# Patient Record
Sex: Male | Born: 1970 | Race: White | Hispanic: No | Marital: Single | Smoking: Former smoker
Health system: Southern US, Community
[De-identification: ages and names within clinical notes are randomized; demographics above are authoritative.]

## PROBLEM LIST (undated history)

## (undated) DIAGNOSIS — K259 Gastric ulcer, unspecified as acute or chronic, without hemorrhage or perforation: Secondary | ICD-10-CM

---

## 2010-10-21 HISTORY — PX: GASTRIC BYPASS: SHX52

## 2015-08-10 ENCOUNTER — Encounter (HOSPITAL_COMMUNITY): Payer: Self-pay | Admitting: Emergency Medicine

## 2015-08-10 ENCOUNTER — Inpatient Hospital Stay (HOSPITAL_COMMUNITY)
Admission: EM | Admit: 2015-08-10 | Discharge: 2015-08-21 | DRG: 329 | Disposition: A | Discharge status: Home / Self Care | Payer: BLUE CROSS/BLUE SHIELD | Attending: Internal Medicine | Admitting: Internal Medicine

## 2015-08-10 DIAGNOSIS — K921 Melena: Secondary | ICD-10-CM | POA: Diagnosis not present

## 2015-08-10 DIAGNOSIS — K56609 Unspecified intestinal obstruction, unspecified as to partial versus complete obstruction: Secondary | ICD-10-CM | POA: Diagnosis not present

## 2015-08-10 DIAGNOSIS — R55 Syncope and collapse: Secondary | ICD-10-CM | POA: Diagnosis present

## 2015-08-10 DIAGNOSIS — K559 Vascular disorder of intestine, unspecified: Secondary | ICD-10-CM | POA: Diagnosis not present

## 2015-08-10 DIAGNOSIS — R0989 Other specified symptoms and signs involving the circulatory and respiratory systems: Secondary | ICD-10-CM

## 2015-08-10 DIAGNOSIS — Z9884 Bariatric surgery status: Secondary | ICD-10-CM

## 2015-08-10 DIAGNOSIS — Z8711 Personal history of peptic ulcer disease: Secondary | ICD-10-CM

## 2015-08-10 DIAGNOSIS — D62 Acute posthemorrhagic anemia: Secondary | ICD-10-CM | POA: Diagnosis present

## 2015-08-10 DIAGNOSIS — Z8249 Family history of ischemic heart disease and other diseases of the circulatory system: Secondary | ICD-10-CM

## 2015-08-10 DIAGNOSIS — K450 Other specified abdominal hernia with obstruction, without gangrene: Secondary | ICD-10-CM | POA: Clinically undetermined

## 2015-08-10 DIAGNOSIS — W1839XA Other fall on same level, initial encounter: Secondary | ICD-10-CM | POA: Diagnosis present

## 2015-08-10 DIAGNOSIS — D649 Anemia, unspecified: Secondary | ICD-10-CM | POA: Diagnosis present

## 2015-08-10 DIAGNOSIS — D509 Iron deficiency anemia, unspecified: Secondary | ICD-10-CM | POA: Diagnosis present

## 2015-08-10 DIAGNOSIS — E876 Hypokalemia: Secondary | ICD-10-CM | POA: Diagnosis present

## 2015-08-10 DIAGNOSIS — K644 Residual hemorrhoidal skin tags: Secondary | ICD-10-CM | POA: Diagnosis present

## 2015-08-10 DIAGNOSIS — E46 Unspecified protein-calorie malnutrition: Secondary | ICD-10-CM | POA: Diagnosis present

## 2015-08-10 DIAGNOSIS — R571 Hypovolemic shock: Secondary | ICD-10-CM | POA: Diagnosis not present

## 2015-08-10 DIAGNOSIS — K922 Gastrointestinal hemorrhage, unspecified: Secondary | ICD-10-CM | POA: Diagnosis present

## 2015-08-10 DIAGNOSIS — N179 Acute kidney failure, unspecified: Secondary | ICD-10-CM | POA: Diagnosis present

## 2015-08-10 DIAGNOSIS — R000 Tachycardia, unspecified: Secondary | ICD-10-CM | POA: Diagnosis not present

## 2015-08-10 DIAGNOSIS — Z833 Family history of diabetes mellitus: Secondary | ICD-10-CM

## 2015-08-10 DIAGNOSIS — R069 Unspecified abnormalities of breathing: Secondary | ICD-10-CM

## 2015-08-10 DIAGNOSIS — R6521 Severe sepsis with septic shock: Secondary | ICD-10-CM | POA: Diagnosis not present

## 2015-08-10 DIAGNOSIS — Z79899 Other long term (current) drug therapy: Secondary | ICD-10-CM

## 2015-08-10 DIAGNOSIS — I10 Essential (primary) hypertension: Secondary | ICD-10-CM | POA: Diagnosis not present

## 2015-08-10 DIAGNOSIS — Z8719 Personal history of other diseases of the digestive system: Secondary | ICD-10-CM | POA: Diagnosis not present

## 2015-08-10 DIAGNOSIS — Z87891 Personal history of nicotine dependence: Secondary | ICD-10-CM

## 2015-08-10 DIAGNOSIS — D689 Coagulation defect, unspecified: Secondary | ICD-10-CM | POA: Diagnosis not present

## 2015-08-10 DIAGNOSIS — K460 Unspecified abdominal hernia with obstruction, without gangrene: Secondary | ICD-10-CM | POA: Diagnosis present

## 2015-08-10 DIAGNOSIS — S0181XA Laceration without foreign body of other part of head, initial encounter: Secondary | ICD-10-CM | POA: Diagnosis present

## 2015-08-10 DIAGNOSIS — A419 Sepsis, unspecified organism: Secondary | ICD-10-CM | POA: Diagnosis present

## 2015-08-10 HISTORY — DX: Gastric ulcer, unspecified as acute or chronic, without hemorrhage or perforation: K25.9

## 2015-08-10 LAB — BASIC METABOLIC PANEL
ANION GAP: 6 (ref 5–15)
BUN: 36 mg/dL — ABNORMAL HIGH (ref 6–20)
CALCIUM: 8.5 mg/dL — AB (ref 8.9–10.3)
CO2: 24 mmol/L (ref 22–32)
CREATININE: 1.17 mg/dL (ref 0.61–1.24)
Chloride: 108 mmol/L (ref 101–111)
Glucose, Bld: 125 mg/dL — ABNORMAL HIGH (ref 65–99)
Potassium: 4.7 mmol/L (ref 3.5–5.1)
Sodium: 138 mmol/L (ref 135–145)

## 2015-08-10 LAB — PROTIME-INR
INR: 1.16
PROTHROMBIN TIME: 14.9 s (ref 11.4–15.2)

## 2015-08-10 LAB — CBC
HCT: 23.7 % — ABNORMAL LOW (ref 39.0–52.0)
Hemoglobin: 7.3 g/dL — ABNORMAL LOW (ref 13.0–17.0)
MCH: 23.2 pg — ABNORMAL LOW (ref 26.0–34.0)
MCHC: 30.8 g/dL (ref 30.0–36.0)
MCV: 75.5 fL — AB (ref 78.0–100.0)
PLATELETS: 334 10*3/uL (ref 150–400)
RBC: 3.14 MIL/uL — AB (ref 4.22–5.81)
RDW: 16.4 % — AB (ref 11.5–15.5)
WBC: 10.1 10*3/uL (ref 4.0–10.5)

## 2015-08-10 LAB — URINE MICROSCOPIC-ADD ON
RBC / HPF: NONE SEEN RBC/hpf (ref 0–5)
SQUAMOUS EPITHELIAL / LPF: NONE SEEN

## 2015-08-10 LAB — ABO/RH: ABO/RH(D): O POS

## 2015-08-10 LAB — PREPARE RBC (CROSSMATCH)

## 2015-08-10 LAB — URINALYSIS, ROUTINE W REFLEX MICROSCOPIC
Glucose, UA: NEGATIVE mg/dL
Hgb urine dipstick: NEGATIVE
KETONES UR: NEGATIVE mg/dL
NITRITE: NEGATIVE
PROTEIN: NEGATIVE mg/dL
Specific Gravity, Urine: 1.025 (ref 1.005–1.030)
pH: 6.5 (ref 5.0–8.0)

## 2015-08-10 LAB — CBG MONITORING, ED: GLUCOSE-CAPILLARY: 97 mg/dL (ref 65–99)

## 2015-08-10 MED ORDER — LIDOCAINE-EPINEPHRINE (PF) 1 %-1:200000 IJ SOLN
20.0000 mL | Freq: Once | INTRAMUSCULAR | Status: AC
  Administered 2015-08-10: 20 mL
  Filled 2015-08-10: qty 30

## 2015-08-10 MED ORDER — SODIUM CHLORIDE 0.9 % IV BOLUS (SEPSIS)
1000.0000 mL | Freq: Once | INTRAVENOUS | Status: AC
  Administered 2015-08-10: 1000 mL via INTRAVENOUS

## 2015-08-10 NOTE — ED Triage Notes (Signed)
Patient BIB [facility] from the [facility] after a syncopal episode. Patient stood up from having  dark red-bloody BM  and passed out. This was 3rd bloody bm of the day.  Patient states syncopal episoed lasted about then crawled to phone to call EMS. Pt has HX of bleeding ulcer, origin unknown, no symptoms in 2 yrs. Laceration noted to chin and forehead. Patient denies neck or back injury. Patient denies pain. VSS. EMS unsuccessful at starting iv. Patient had Gastric bypass in 2012.

## 2015-08-10 NOTE — ED Notes (Signed)
After moving around in bed, patient realizes he has pain in his rt leg. States knee hurts when bends it.

## 2015-08-10 NOTE — ED Provider Notes (Signed)
[facility]-EMERGENCY DEPT Provider Note   CSN: [id number] Arrival date & time: 08/10/15  2028   First MD Initiated Contact with Patient 08/10/15 2118    By signing my name below, I, Levon Hedger, attest that this documentation has been prepared under the direction and in the presence of non-physician practitioner, Tommy Rainwater, Electronically Signed: Levon Hedger, Scribe. 08/10/2015. 9:33 PM.   History   Chief Complaint Chief Complaint  Patient presents with  . Loss of Consciousness    HPI Walter Shields is a 45 y.o. male with PMHx of gastric ulcer brought in by ambulance who presents to the Emergency Department complaining of sudden onset syncope PTA. Pt states he had a dark red bowel movement, stood up from using the bathroom, and lost consciousness. Pt was alone during the incident. He estimates he was unconscious for one minute before waking up and calling EMS. Pt notes associated hematochezia and lacerations to chin and forehead from the fall. He denies tobacco use. Pt is not taking any blood thinners. He denies vomiting, melena, abdominal pain, or dizziness. Pt states he is otherwise in good health.  The history is provided by the patient. No language interpreter was used.    Past Medical History:  Diagnosis Date  . Gastric ulcer     There are no active problems to display for this patient.   Past Surgical History:  Procedure Laterality Date  . GASTRIC BYPASS  10/21/2010      Home Medications    Prior to Admission medications   Not on File    Family History No family history on file.  Social History Social History  Substance Use Topics  . Smoking status: Not on file  . Smokeless tobacco: Not on file  . Alcohol use Not on file     Allergies   Review of patient's allergies indicates not on file.   Review of Systems Review of Systems  Constitutional: Negative for fever.  Gastrointestinal: Positive for blood in stool. Negative for abdominal pain  and vomiting.  Skin: Positive for wound.  Neurological: Positive for syncope. Negative for dizziness.  Hematological: Does not bruise/bleed easily.  All other systems reviewed and are negative.    Physical Exam Updated Vital Signs BP 113/87 (BP Location: Left Arm)   Pulse 85   Temp 98.7 F (37.1 C) (Oral)   Resp 15   Ht 5\' 7"  (1.702 m)   Wt 175 lb (79.4 kg)   SpO2 100%   BMI 27.41 kg/m   Physical Exam  Constitutional: He appears well-developed and well-nourished.  Patient is awake, alert and in NAD.  HENT:  Head: Normocephalic and atraumatic.  Eyes:  Conjunctiva pale   Neck: Neck supple.  Cardiovascular: Normal rate and regular rhythm.   No murmur heard. Pulmonary/Chest: Effort normal and breath sounds normal. No respiratory distress.  Abdominal: Soft. There is no tenderness.  Genitourinary:  Genitourinary Comments: There is dark red blood present at the rectum.  Musculoskeletal: He exhibits no edema.  Neurological: He is alert.  Skin: Skin is warm and dry. Capillary refill takes 2 to 3 seconds.  Psychiatric: He has a normal mood and affect.  Nursing note and vitals reviewed.    ED Treatments / Results  DIAGNOSTIC STUDIES:  Oxygen Saturation is 100% on RA, normal by my interpretation.    COORDINATION OF CARE:  9:25 PM Will order Type and Screen and Protime-INR, Discussed treatment plan with pt at bedside and pt agreed to plan.  Labs (all  labs ordered are listed, but only abnormal results are displayed) Labs Reviewed  CBC  URINALYSIS, ROUTINE W REFLEX MICROSCOPIC (NOT AT [facility])  BASIC METABOLIC PANEL  CBG MONITORING, ED    EKG  EKG Interpretation None       Radiology No results found.  Procedures Procedures (including critical care time) LACERATION REPAIR Performed by: Elpidio Anis A Authorized by: Elpidio Anis A Consent: Verbal consent obtained. Risks and benefits: risks, benefits and alternatives were discussed Consent given by:  patient Patient identity confirmed: provided demographic data Prepped and Draped in normal sterile fashion Wound explored  Laceration Location: chin  Laceration Length: 3cm  No Foreign Bodies seen or palpated  Anesthesia: local infiltration  Local anesthetic: lidocaine 2% w/epinephrine  Anesthetic total: 2 ml  Irrigation method: syringe Amount of cleaning: standard  Skin closure: 6-0 prolene  Number of sutures: 8  Technique: simple interrupted  Patient tolerance: Patient tolerated the procedure well with no immediate complications.  LACERATION REPAIR Performed by: Elpidio Anis A Authorized by: Elpidio Anis A Consent: Verbal consent obtained. Risks and benefits: risks, benefits and alternatives were discussed Consent given by: patient Patient identity confirmed: provided demographic data Prepped and Draped in normal sterile fashion Wound explored  Laceration Location: left forehead  Laceration Length: 2 cm  No Foreign Bodies seen or palpated  Anesthesia: local infiltration  Local anesthetic: lidocaine 2% w/epinephrine  Anesthetic total: 1 ml  Irrigation method: syringe Amount of cleaning: standard  Skin closure: 6-0 prolene  Number of sutures: 4  Technique: simple interrupted  Patient tolerance: Patient tolerated the procedure well with no immediate complications.  Medications Ordered in ED Medications - No data to display   Initial Impression / Assessment and Plan / ED Course  I have reviewed the triage vital signs and the nursing notes.  Pertinent labs & imaging results that were available during my care of the patient were reviewed by me and considered in my medical decision making (see chart for details).  Clinical Course    Patient with multiple bloody bowel movements today (x 3) with brief syncopal episode after 3rd bowel movement. He reports history of GI bleeding in the past secondary to "lower GI ulcerations" cared for at [facility]. Records have been requested.   Patient has a hemoglobin of 7.3. No tachycardia or hypotension and appears comfortable in NAD. Transfusion ordered. Lacerations repaired per procedure note. Patient appears stable with symptomatic anemia and new GI bleed, suspect lower. Will admit to hospitalist with anticipated GI consult while inpatient.  CRITICAL CARE Performed by: Elpidio Anis A   Total critical care time: 20 minutes  Critical care time was exclusive of separately billable procedures and treating other patients.  Critical care was necessary to treat or prevent imminent or life-threatening deterioration.  Critical care was time spent personally by me on the following activities: development of treatment plan with patient and/or surrogate as well as nursing, discussions with consultants, evaluation of patient's response to treatment, examination of patient, obtaining history from patient or surrogate, ordering and performing treatments and interventions, ordering and review of laboratory studies, ordering and review of radiographic studies, pulse oximetry and re-evaluation of patient's condition.   Final Clinical Impressions(s) / ED Diagnoses   Final diagnoses:  None  1. Lower GI bleeding 2. Symptomatic anemia requiring transfusion 3. Facial lacerations  I personally performed the services described in this documentation, which was scribed in my presence. The recorded information has been reviewed and is accurate.  New Prescriptions New Prescriptions   No medications on file     Elpidio Anis, PA-C 08/10/15 2358    Maia Plan, MD 08/11/15 1149

## 2015-08-10 NOTE — ED Notes (Signed)
Bed: UY40 Expected date:  Expected time:  Means of arrival:  Comments: EMS- syncopal episode; lacerations;

## 2015-08-11 ENCOUNTER — Encounter (HOSPITAL_COMMUNITY): Payer: Self-pay | Admitting: Family Medicine

## 2015-08-11 ENCOUNTER — Encounter (HOSPITAL_COMMUNITY)
Admission: EM | Disposition: A | Discharge status: Home / Self Care | Payer: Self-pay | Source: Home / Self Care | Attending: Internal Medicine

## 2015-08-11 DIAGNOSIS — E46 Unspecified protein-calorie malnutrition: Secondary | ICD-10-CM | POA: Diagnosis present

## 2015-08-11 DIAGNOSIS — K3189 Other diseases of stomach and duodenum: Secondary | ICD-10-CM | POA: Diagnosis not present

## 2015-08-11 DIAGNOSIS — Z9884 Bariatric surgery status: Secondary | ICD-10-CM | POA: Diagnosis not present

## 2015-08-11 DIAGNOSIS — R000 Tachycardia, unspecified: Secondary | ICD-10-CM | POA: Diagnosis not present

## 2015-08-11 DIAGNOSIS — I10 Essential (primary) hypertension: Secondary | ICD-10-CM | POA: Diagnosis not present

## 2015-08-11 DIAGNOSIS — A419 Sepsis, unspecified organism: Secondary | ICD-10-CM | POA: Diagnosis present

## 2015-08-11 DIAGNOSIS — N179 Acute kidney failure, unspecified: Secondary | ICD-10-CM | POA: Diagnosis present

## 2015-08-11 DIAGNOSIS — K559 Vascular disorder of intestine, unspecified: Secondary | ICD-10-CM | POA: Diagnosis present

## 2015-08-11 DIAGNOSIS — W1839XA Other fall on same level, initial encounter: Secondary | ICD-10-CM | POA: Diagnosis present

## 2015-08-11 DIAGNOSIS — K460 Unspecified abdominal hernia with obstruction, without gangrene: Secondary | ICD-10-CM | POA: Diagnosis present

## 2015-08-11 DIAGNOSIS — D649 Anemia, unspecified: Secondary | ICD-10-CM | POA: Diagnosis not present

## 2015-08-11 DIAGNOSIS — Z8719 Personal history of other diseases of the digestive system: Secondary | ICD-10-CM | POA: Diagnosis not present

## 2015-08-11 DIAGNOSIS — K922 Gastrointestinal hemorrhage, unspecified: Secondary | ICD-10-CM | POA: Diagnosis present

## 2015-08-11 DIAGNOSIS — D509 Iron deficiency anemia, unspecified: Secondary | ICD-10-CM | POA: Diagnosis present

## 2015-08-11 DIAGNOSIS — S0181XA Laceration without foreign body of other part of head, initial encounter: Secondary | ICD-10-CM | POA: Diagnosis present

## 2015-08-11 DIAGNOSIS — R571 Hypovolemic shock: Secondary | ICD-10-CM | POA: Diagnosis not present

## 2015-08-11 DIAGNOSIS — R6521 Severe sepsis with septic shock: Secondary | ICD-10-CM | POA: Diagnosis not present

## 2015-08-11 DIAGNOSIS — Z87891 Personal history of nicotine dependence: Secondary | ICD-10-CM | POA: Diagnosis not present

## 2015-08-11 DIAGNOSIS — J9600 Acute respiratory failure, unspecified whether with hypoxia or hypercapnia: Secondary | ICD-10-CM | POA: Diagnosis not present

## 2015-08-11 DIAGNOSIS — Z8711 Personal history of peptic ulcer disease: Secondary | ICD-10-CM | POA: Diagnosis not present

## 2015-08-11 DIAGNOSIS — R579 Shock, unspecified: Secondary | ICD-10-CM | POA: Diagnosis not present

## 2015-08-11 DIAGNOSIS — K921 Melena: Secondary | ICD-10-CM | POA: Diagnosis present

## 2015-08-11 DIAGNOSIS — Z8249 Family history of ischemic heart disease and other diseases of the circulatory system: Secondary | ICD-10-CM | POA: Diagnosis not present

## 2015-08-11 DIAGNOSIS — E876 Hypokalemia: Secondary | ICD-10-CM | POA: Diagnosis present

## 2015-08-11 DIAGNOSIS — R739 Hyperglycemia, unspecified: Secondary | ICD-10-CM | POA: Diagnosis not present

## 2015-08-11 DIAGNOSIS — K2971 Gastritis, unspecified, with bleeding: Secondary | ICD-10-CM | POA: Diagnosis not present

## 2015-08-11 DIAGNOSIS — D689 Coagulation defect, unspecified: Secondary | ICD-10-CM | POA: Diagnosis not present

## 2015-08-11 DIAGNOSIS — R55 Syncope and collapse: Secondary | ICD-10-CM | POA: Diagnosis present

## 2015-08-11 DIAGNOSIS — K644 Residual hemorrhoidal skin tags: Secondary | ICD-10-CM | POA: Diagnosis not present

## 2015-08-11 DIAGNOSIS — D62 Acute posthemorrhagic anemia: Secondary | ICD-10-CM | POA: Diagnosis present

## 2015-08-11 HISTORY — PX: ESOPHAGOGASTRODUODENOSCOPY: SHX5428

## 2015-08-11 LAB — HEMOGLOBIN AND HEMATOCRIT, BLOOD
HCT: 23.3 % — ABNORMAL LOW (ref 39.0–52.0)
HEMATOCRIT: 23.9 % — AB (ref 39.0–52.0)
HEMATOCRIT: 24.7 % — AB (ref 39.0–52.0)
HEMOGLOBIN: 7.8 g/dL — AB (ref 13.0–17.0)
Hemoglobin: 7.5 g/dL — ABNORMAL LOW (ref 13.0–17.0)
Hemoglobin: 8 g/dL — ABNORMAL LOW (ref 13.0–17.0)

## 2015-08-11 LAB — PREPARE RBC (CROSSMATCH)

## 2015-08-11 LAB — HEPATIC FUNCTION PANEL
ALK PHOS: 38 U/L (ref 38–126)
ALT: 13 U/L — AB (ref 17–63)
AST: 16 U/L (ref 15–41)
Albumin: 3.2 g/dL — ABNORMAL LOW (ref 3.5–5.0)
BILIRUBIN DIRECT: 0.2 mg/dL (ref 0.1–0.5)
BILIRUBIN TOTAL: 1.1 mg/dL (ref 0.3–1.2)
Indirect Bilirubin: 0.9 mg/dL (ref 0.3–0.9)
Total Protein: 5 g/dL — ABNORMAL LOW (ref 6.5–8.1)

## 2015-08-11 LAB — BASIC METABOLIC PANEL
Anion gap: 5 (ref 5–15)
BUN: 36 mg/dL — ABNORMAL HIGH (ref 6–20)
CALCIUM: 8.3 mg/dL — AB (ref 8.9–10.3)
CO2: 24 mmol/L (ref 22–32)
CREATININE: 1.14 mg/dL (ref 0.61–1.24)
Chloride: 111 mmol/L (ref 101–111)
GFR calc Af Amer: >60 mL/min (ref >60)
GFR calc non Af Amer: >60 mL/min (ref >60)
GLUCOSE: 102 mg/dL — AB (ref 65–99)
Potassium: 4.4 mmol/L (ref 3.5–5.1)
Sodium: 140 mmol/L (ref 135–145)

## 2015-08-11 LAB — IRON AND TIBC
Iron: 77 ug/dL (ref 45–182)
Saturation Ratios: 25 % (ref 17.9–39.5)
TIBC: 305 ug/dL (ref 250–450)
UIBC: 228 ug/dL

## 2015-08-11 LAB — FERRITIN: Ferritin: 10 ng/mL — ABNORMAL LOW (ref 24–336)

## 2015-08-11 LAB — GLUCOSE, CAPILLARY: Glucose-Capillary: 98 mg/dL (ref 65–99)

## 2015-08-11 LAB — MRSA PCR SCREENING: MRSA by PCR: NEGATIVE

## 2015-08-11 LAB — VITAMIN B12: VITAMIN B 12: 1285 pg/mL — AB (ref 180–914)

## 2015-08-11 SURGERY — EGD (ESOPHAGOGASTRODUODENOSCOPY)
Anesthesia: Moderate Sedation

## 2015-08-11 MED ORDER — MIDAZOLAM HCL 5 MG/ML IJ SOLN
INTRAMUSCULAR | Status: AC
  Filled 2015-08-11: qty 2

## 2015-08-11 MED ORDER — PANTOPRAZOLE SODIUM 40 MG IV SOLR
40.0000 mg | Freq: Two times a day (BID) | INTRAVENOUS | Status: DC
  Administered 2015-08-14 – 2015-08-15 (×3): 40 mg via INTRAVENOUS
  Filled 2015-08-11 (×3): qty 40

## 2015-08-11 MED ORDER — ACETAMINOPHEN 650 MG RE SUPP: 650.0000 mg | Freq: Four times a day (QID) | RECTAL | Status: DC | PRN

## 2015-08-11 MED ORDER — ADULT MULTIVITAMIN W/MINERALS CH
1.0000 | ORAL_TABLET | Freq: Every day | ORAL | Status: DC
  Administered 2015-08-13 – 2015-08-14 (×2): 1 via ORAL
  Filled 2015-08-11 (×2): qty 1

## 2015-08-11 MED ORDER — SODIUM CHLORIDE 0.9 % IV SOLN
8.0000 mg/h | INTRAVENOUS | Status: DC
  Administered 2015-08-11 – 2015-08-13 (×5): 8 mg/h via INTRAVENOUS
  Filled 2015-08-11 (×10): qty 80

## 2015-08-11 MED ORDER — BUTAMBEN-TETRACAINE-BENZOCAINE 2-2-14 % EX AERO
INHALATION_SPRAY | CUTANEOUS | Status: DC | PRN
  Administered 2015-08-11: 1 via TOPICAL

## 2015-08-11 MED ORDER — FENTANYL CITRATE (PF) 100 MCG/2ML IJ SOLN
INTRAMUSCULAR | Status: AC
  Filled 2015-08-11: qty 2

## 2015-08-11 MED ORDER — MIDAZOLAM HCL 10 MG/2ML IJ SOLN
INTRAMUSCULAR | Status: DC | PRN
  Administered 2015-08-11 (×2): 2 mg via INTRAVENOUS
  Administered 2015-08-11: 1 mg via INTRAVENOUS
  Administered 2015-08-11 (×2): 2 mg via INTRAVENOUS
  Administered 2015-08-11: 1 mg via INTRAVENOUS

## 2015-08-11 MED ORDER — SODIUM CHLORIDE 0.9 % IV SOLN
Freq: Once | INTRAVENOUS | Status: AC
  Administered 2015-08-11: 11:00:00 via INTRAVENOUS

## 2015-08-11 MED ORDER — FENTANYL CITRATE (PF) 100 MCG/2ML IJ SOLN
INTRAMUSCULAR | Status: DC | PRN
  Administered 2015-08-11 (×6): 25 ug via INTRAVENOUS

## 2015-08-11 MED ORDER — OMEGA-3-ACID ETHYL ESTERS 1 G PO CAPS
1.0000 g | ORAL_CAPSULE | Freq: Every day | ORAL | Status: DC
  Administered 2015-08-13 – 2015-08-14 (×2): 1 g via ORAL
  Filled 2015-08-11 (×4): qty 1

## 2015-08-11 MED ORDER — CYANOCOBALAMIN 500 MCG PO TABS
500.0000 ug | ORAL_TABLET | Freq: Every day | ORAL | Status: DC
  Administered 2015-08-13 – 2015-08-14 (×2): 500 ug via ORAL
  Filled 2015-08-11 (×4): qty 1

## 2015-08-11 MED ORDER — SODIUM CHLORIDE 0.9% FLUSH
3.0000 mL | Freq: Two times a day (BID) | INTRAVENOUS | Status: DC
  Administered 2015-08-11 – 2015-08-15 (×6): 3 mL via INTRAVENOUS

## 2015-08-11 MED ORDER — DIPHENHYDRAMINE HCL 50 MG/ML IJ SOLN
INTRAMUSCULAR | Status: AC
  Filled 2015-08-11: qty 1

## 2015-08-11 MED ORDER — DIPHENHYDRAMINE HCL 50 MG/ML IJ SOLN
INTRAMUSCULAR | Status: DC | PRN
  Administered 2015-08-11 (×2): 25 mg via INTRAVENOUS

## 2015-08-11 MED ORDER — FERROUS SULFATE 325 (65 FE) MG PO TABS
325.0000 mg | ORAL_TABLET | Freq: Every day | ORAL | Status: DC
  Administered 2015-08-11 – 2015-08-14 (×4): 325 mg via ORAL
  Filled 2015-08-11 (×4): qty 1

## 2015-08-11 MED ORDER — SODIUM CHLORIDE 0.9 % IV SOLN
INTRAVENOUS | Status: AC
  Administered 2015-08-11: 05:00:00 via INTRAVENOUS

## 2015-08-11 MED ORDER — SODIUM CHLORIDE 0.9 % IV SOLN
80.0000 mg | Freq: Once | INTRAVENOUS | Status: AC
  Administered 2015-08-11: 80 mg via INTRAVENOUS
  Filled 2015-08-11: qty 80

## 2015-08-11 MED ORDER — ACETAMINOPHEN 325 MG PO TABS
650.0000 mg | ORAL_TABLET | Freq: Four times a day (QID) | ORAL | Status: DC | PRN
Start: 2015-08-11 — End: 2015-08-14
  Administered 2015-08-11 – 2015-08-14 (×2): 650 mg via ORAL
  Filled 2015-08-11 (×2): qty 2

## 2015-08-11 NOTE — H&P (View-Only) (Signed)
Consultation  Referring Provider:     Dr. Antionette Char Primary Care Physician:  No primary care provider on file. Primary Gastroenterologist:     None    Reason for Consultation:     Upper GI Bleed         HPI:   Walter Shields is a 45 y.o. male with a history of obesity s/p gastric bypass around 2012 or so, with a history of PUD 2 years ago, presenting with melena. He reports he lives in [place] and was in town working yesterday when he had a black bowel movement mid day. He subsequently had 2 additional bowel movements later that afternoon, black and loose, and during the last episode he had syncope and hit his head. No nausea or vomiting. Mild abdominal pain in the upper abdomen. He states he had similar symptoms when he presented for a GI bleed 2 years ago, he reports due to an ulcer. He states he also had a colonoscopy around that time which was normal. He does endorse taking "ulcer medication" daily but unsure of which one it is, he thinks maybe prilosec. He denies taking any NSAIDs. He denies any other medical problems other than chronic iron deficiency for which he takes oral iron. Stools at baseline are brown despite taking iron. Hgb on admission was in 7s, he does not know baseline Hgb. BUN/Cr ratio elevation as well. He was given 2 units PRBC by primary service given concern for severe bleeding on admission. Last BM he states was yesterday afternoon, none since admission.   Past Medical History:  Diagnosis Date  . Gastric ulcer   obesity  Past Surgical History:  Procedure Laterality Date  . GASTRIC BYPASS  10/21/2010    Family History  Problem Relation Age of Onset  . Hypertension Mother   . Heart failure Mother   . Diabetes Mother   . Heart failure Father   . Hyperlipidemia Father   . Hypertension Father     Social History  Substance Use Topics  . Smoking status: Former Smoker    Types: Cigarettes    Quit date: 04/27/2009  . Smokeless tobacco: Never Used  . Alcohol use  No    Prior to Admission medications   Medication Sig Start Date End Date Taking? Authorizing Provider  ferrous sulfate 325 (65 FE) MG EC tablet Take 325 mg by mouth daily with breakfast.   Yes Historical Provider, MD  Multiple Vitamins-Minerals (MULTIVITAMIN ADULT PO) Take 1 tablet by mouth daily.   Yes Historical Provider, MD  Omega-3 Fatty Acids (FISH OIL) 1000 MG CAPS Take 1 capsule by mouth daily.   Yes Historical Provider, MD  vitamin B-12 (CYANOCOBALAMIN) 500 MCG tablet Take 500 mcg by mouth daily.   Yes Historical Provider, MD    Current Facility-Administered Medications  Medication Dose Route Frequency Provider Last Rate Last Dose  . 0.9 %  sodium chloride infusion   Intravenous Continuous Briscoe Deutscher, MD 75 mL/hr at 08/11/15 0431    . acetaminophen (TYLENOL) tablet 650 mg  650 mg Oral Q6H PRN Briscoe Deutscher, MD       Or  . acetaminophen (TYLENOL) suppository 650 mg  650 mg Rectal Q6H PRN Lavone Neri Opyd, MD      . cyanocobalamin tablet 500 mcg  500 mcg Oral Daily Timothy S Opyd, MD      . ferrous sulfate tablet 325 mg  325 mg Oral Q breakfast Briscoe Deutscher, MD      .  multivitamin with minerals tablet 1 tablet  1 tablet Oral Daily Lavone Neri Opyd, MD      . omega-3 acid ethyl esters (LOVAZA) capsule 1 g  1 g Oral Daily Lavone Neri Opyd, MD      . pantoprazole (PROTONIX) 80 mg in sodium chloride 0.9 % 250 mL (0.32 mg/mL) infusion  8 mg/hr Intravenous Continuous Briscoe Deutscher, MD 25 mL/hr at 08/11/15 0154 8 mg/hr at 08/11/15 0154  . [START ON 08/14/2015] pantoprazole (PROTONIX) injection 40 mg  40 mg Intravenous Q12H Timothy S Opyd, MD      . sodium chloride flush (NS) 0.9 % injection 3 mL  3 mL Intravenous Q12H Briscoe Deutscher, MD        Allergies as of 08/10/2015  . (No Known Allergies)     Review of Systems:    As per HPI, otherwise negative      Physical Exam:  Vital signs in last 24 hours: Temp:  [98.3 F (36.8 C)-99 F (37.2 C)] 98.8 F (37.1 C) (08/01  0430) Pulse Rate:  [76-104] 87 (08/01 0600) Resp:  [10-25] 17 (08/01 0600) BP: (110-159)/(74-107) 137/82 (08/01 0600) SpO2:  [97 %-100 %] 98 % (08/01 0600) Weight:  [165 lb 2 oz (74.9 kg)-175 lb (79.4 kg)] 165 lb 2 oz (74.9 kg) (08/01 0115)   General:   Pleasant in NAD Head:  Normocephalic and atraumatic. Eyes:   No icterus.   Conjunctiva pale Ears:  Normal auditory acuity. Neck:  Supple Lungs:  Respirations even and unlabored. Lungs clear to auscultation bilaterally.    Heart:  Regular rate and rhythm; no MRG Abdomen:  Soft, nondistended, nontender. Normal bowel sounds. No appreciable masses or   Rectal:  Not performed.  Msk:  Symmetrical without gross deformities.  Extremities:  Without edema. Neurologic:  Alert and  oriented x4;  grossly normal neurologically. Skin:  Intact without significant lesions or rashes. Psych:  Alert and cooperative. Normal affect.  LAB RESULTS:  Recent Labs  08/10/15 2218  WBC 10.1  HGB 7.3*  HCT 23.7*  PLT 334   BMET  Recent Labs  08/10/15 2218  NA 138  K 4.7  CL 108  CO2 24  GLUCOSE 125*  BUN 36*  CREATININE 1.17  CALCIUM 8.5*   LFT No results for input(s): PROT, ALBUMIN, AST, ALT, ALKPHOS, BILITOT, BILIDIR, IBILI in the last 72 hours. PT/INR  Recent Labs  08/10/15 2138  LABPROT 14.9  INR 1.16    STUDIES: No results found.   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 Hgb of 7s on admission, he was given PRBC transfusion given concern for severe GI bleeding / syncope and placed on IV PPI. He endorses chronic iron deficiency post gastric bypass but we don't know his baseline Hgb. He has not had any further bowel movements since admission, post-transfusion H/H pending but he is hemodynamically stable at this time and feels  improved.   Patient warrants upper endoscopy to further evaluate symptoms today. Time to be determined, likely this afternoon based on schedule however if he has symptoms of active bleeding or instability in the interim please contact us. I discussed risks / benefits of endoscopy and sedation with him and he wished to proceed. Please keep  NPO and on IV PPI for now, will await post-transfusion CBC.   Ileene Patrick, MD [facility] Pager [phone]

## 2015-08-11 NOTE — Progress Notes (Signed)
PROGRESS NOTE    Walter Shields  JXB:[id number] DOB: [birth year] DOA: 08/10/2015 PCP: No primary care provider on file.   Outpatient Specialists:     Brief Narrative:  Walter Shields is a 45 y.o. male with medical history significant for gastric bypass in 2012 and bleeding peptic ulcer approximately 2 years ago that required transfusion, now presenting to the emergency department following a syncopal episode after his third bloody bowel movement of the day. Patient is from out of town, here for business, and reports going to bed last night in his usual state of health. Upon waking this morning, he noted nausea and generalized malaise and called in sick to work. At approximately 1:30 PM, he had a bowel movement with a large amount of dark blood. Patient went on to have 2 more bowel movements with what he describes as large amounts of dark blood mixed in with forms stool. After the third, upon standing, patient "passed out," falling and striking his head. EMS was activated for transport to the hospital. Patient denies any GI bleeding since the ulcer was treated approximately 2 years ago. He does not take a PPI or H2 blocker. He denies use of NSAIDs, aspirin, or alcohol. Aside from nausea and general malaise, patient has no particular complaints. Specifically, he denies abdominal pain or vomiting. He also denies recent fevers, chills, chest pain, palpitations, dyspnea, or cough.   Assessment & Plan:   Principal Problem:   Acute GI bleeding Active Problems:   Syncope   Symptomatic anemia   History of bleeding peptic ulcer   Lower GI bleeding   1. Acute GI bleed with symptomatic anemia  - With nausea, hx of gastric bypass and subsequent PUD, and elevated BUN, there is concern for UGI source  - Agency GI- plan for EGD - Initial Hgb 7.3 with MCV 73.5; platelets and INR wnl; outside records are currently being sought  -  2 units pRBCs ordered for immediate transfusion -recheck- 7.8- repeat  stat now and if still low, transfuse 1 unit - Protonix bolus and infusion started  - No apparant liver disease - Keep NPO until after procedure  2. Syncope  - Occurred upon standing after pt had 3rd bloody BM of the day   - Suspected secondary to orthostasis in setting of significant GI blood-loss  - No preceding sxs to suggest a cardiac etiology and no arrhythmia noted on telemetry monitoring in ED - Will hold-off on further eval with echocardiogram as etiology seems to be well-established   -will ambulate patient after EGD to evaluate for on-going issues  3. Hx of bleeding PUD  - Pt denies use of NSAIDs or alcohol  - Does not use a PPI or H2-blocker at home  - Currently on IV PPI as above   - Outside records have been requested   4. Hx of gastric bypass - Roux-en-Y - Performed in 2012  - Outside records requested   DVT prophylaxis:  SCD's  Code Status: Full Code   Family Communication: patient  Disposition Plan:     Consultants:   GI  Procedures:      Subjective: No further bleeding per patient   Objective: Vitals:   08/11/15 0430 08/11/15 0500 08/11/15 0600 08/11/15 0800  BP: 123/74 134/85 137/82   Pulse: 84 87 87   Resp: 16 17 17    Temp: 98.8 F (37.1 C)   98.8 F (37.1 C)  TempSrc: Oral   Oral  SpO2: 99% 98% 98%   Weight:  Height:        Intake/Output Summary (Last 24 hours) at 08/11/15 0845 Last data filed at 08/11/15 0721  Gross per 24 hour  Intake           840.75 ml  Output              475 ml  Net           365.75 ml   Filed Weights   08/10/15 2042 08/11/15 0115  Weight: 79.4 kg (175 lb) 74.9 kg (165 lb 2 oz)    Examination:  General exam: Appears calm and comfortable- pale appearing post transfusion  Respiratory system: Clear to auscultation. Respiratory effort normal. Cardiovascular system: S1 & S2 heard, RRR. No JVD, murmurs, rubs, gallops or clicks. No pedal edema. Gastrointestinal system: Abdomen is nondistended,  soft and nontender. No organomegaly or masses felt. Normal bowel sounds heard. Central nervous system: Alert and oriented. No focal neurological deficits. Extremities: Symmetric 5 x 5 power. Skin: No rashes, lesions or ulcers Psychiatry: Judgement and insight appear normal. Mood & affect appropriate.     Data Reviewed: I have personally reviewed following labs and imaging studies  CBC:  Recent Labs Lab 08/10/15 2218 08/11/15 0743  WBC 10.1  --   HGB 7.3* 7.8*  HCT 23.7* 23.9*  MCV 75.5*  --   PLT 334  --    Basic Metabolic Panel:  Recent Labs Lab 08/10/15 2218 08/11/15 0743  NA 138 140  K 4.7 4.4  CL 108 111  CO2 24 24  GLUCOSE 125* 102*  BUN 36* 36*  CREATININE 1.17 1.14  CALCIUM 8.5* 8.3*   GFR: Estimated Creatinine Clearance: 76.5 mL/min (by C-G formula based on SCr of 1.14 mg/dL). Liver Function Tests:  Recent Labs Lab 08/11/15 0743  AST 16  ALT 13*  ALKPHOS 38  BILITOT 1.1  PROT 5.0*  ALBUMIN 3.2*   No results for input(s): LIPASE, AMYLASE in the last 168 hours. No results for input(s): AMMONIA in the last 168 hours. Coagulation Profile:  Recent Labs Lab 08/10/15 2138  INR 1.16   Cardiac Enzymes: No results for input(s): CKTOTAL, CKMB, CKMBINDEX, TROPONINI in the last 168 hours. BNP (last 3 results) No results for input(s): PROBNP in the last 8760 hours. HbA1C: No results for input(s): HGBA1C in the last 72 hours. CBG:  Recent Labs Lab 08/10/15 2052 08/11/15 0753  GLUCAP 97 98   Lipid Profile: No results for input(s): CHOL, HDL, LDLCALC, TRIG, CHOLHDL, LDLDIRECT in the last 72 hours. Thyroid Function Tests: No results for input(s): TSH, T4TOTAL, FREET4, T3FREE, THYROIDAB in the last 72 hours. Anemia Panel: No results for input(s): VITAMINB12, FOLATE, FERRITIN, TIBC, IRON, RETICCTPCT in the last 72 hours. Urine analysis:    Component Value Date/Time   COLORURINE YELLOW 08/10/2015 2038   APPEARANCEUR CLEAR 08/10/2015 2038    LABSPEC 1.025 08/10/2015 2038   PHURINE 6.5 08/10/2015 2038   GLUCOSEU NEGATIVE 08/10/2015 2038   HGBUR NEGATIVE 08/10/2015 2038   BILIRUBINUR SMALL (A) 08/10/2015 2038   KETONESUR NEGATIVE 08/10/2015 2038   PROTEINUR NEGATIVE 08/10/2015 2038   NITRITE NEGATIVE 08/10/2015 2038   LEUKOCYTESUR TRACE (A) 08/10/2015 2038    ) Recent Results (from the past 240 hour(s))  MRSA PCR Screening     Status: None   Collection Time: 08/11/15  1:31 AM  Result Value Ref Range Status   MRSA by PCR NEGATIVE NEGATIVE Final    Comment:        The GeneXpert  MRSA Assay (FDA approved for NASAL specimens only), is one component of a comprehensive MRSA colonization surveillance program. It is not intended to diagnose MRSA infection nor to guide or monitor treatment for MRSA infections.       Anti-infectives    None       Radiology Studies: No results found.      Scheduled Meds: . cyanocobalamin  500 mcg Oral Daily  . ferrous sulfate  325 mg Oral Q breakfast  . multivitamin with minerals  1 tablet Oral Daily  . omega-3 acid ethyl esters  1 g Oral Daily  . [START ON 08/14/2015] pantoprazole  40 mg Intravenous Q12H  . sodium chloride flush  3 mL Intravenous Q12H   Continuous Infusions: . sodium chloride 75 mL/hr at 08/11/15 0431  . pantoprozole (PROTONIX) infusion 8 mg/hr (08/11/15 0154)     LOS: 0 days    Time spent: 35 min    JESSICA U VANN, DO [facility] Pager [phone]  If 7PM-7AM, please contact night-coverage [email or web address] Password [email or web address] 08/11/2015, 8:45 AM

## 2015-08-11 NOTE — Interval H&P Note (Signed)
History and Physical Interval Note:  08/11/2015 3:35 PM  Walter Shields  has presented today for surgery, with the diagnosis of upper GI bleed  The various methods of treatment have been discussed with the patient and family. After consideration of risks, benefits and other options for treatment, the patient has consented to  Procedure(s): ESOPHAGOGASTRODUODENOSCOPY (EGD) (N/A) as a surgical intervention .  The patient's history has been reviewed, patient examined, no change in status, stable for surgery.  I have reviewed the patient's chart and labs.  Questions were answered to the patient's satisfaction.     Reeves Forth Armbruster

## 2015-08-11 NOTE — Op Note (Signed)
[facility] Patient Name: Walter Shields Procedure Date: 08/11/2015 MRN: [id number] Attending MD: Willaim Rayas. [name] , MD Date of Birth: 21-Jul-1970 CSN: [id number] Age: 45 Admit Type: Inpatient Procedure:                Upper GI endoscopy Indications:              Suspected upper gastrointestinal bleeding, history                            of gastric bypass Providers:                Viviann Spare P. [name], MD, Priscella Mann, RN,                            Beryle Beams, Technician Referring MD:              Medicines:                Diphenhydramine 50 mg IV, Fentanyl 150 micrograms                            IV, Midazolam 10 mg IV Complications:            No immediate complications. Estimated blood loss:                            Minimal. Estimated Blood Loss:     Estimated blood loss was minimal. Procedure:                Pre-Anesthesia Assessment:                           - Prior to the procedure, a History and Physical                            was performed, and patient medications and                            allergies were reviewed. The patient's tolerance of                            previous anesthesia was also reviewed. The risks                            and benefits of the procedure and the sedation                            options and risks were discussed with the patient.                            All questions were answered, and informed consent                            was obtained. Prior Anticoagulants: The patient has                            taken  no previous anticoagulant or antiplatelet                            agents. ASA Grade Assessment: III - A patient with                            severe systemic disease. After reviewing the risks                            and benefits, the patient was deemed in                            satisfactory condition to undergo the procedure.                           After obtaining informed  consent, the endoscope was                            passed under direct vision. Throughout the                            procedure, the patient's blood pressure, pulse, and                            oxygen saturations were monitored continuously. The                            EG-[id number] ([id number]) scope was introduced through the                            mouth, and advanced to the jejunum. The upper GI                            endoscopy was accomplished without difficulty. The                            patient tolerated the procedure well. Scope In: Scope Out: Findings:      The examined esophagus was normal.      Evidence of a gastric bypass was found. A gastric pouch was found which       appeared healthy      Mucosal changes were found at the anastomosis. There was a red spot       noted at the surgical anastomosis between the gastric pouch and small       bowel. No active bleeding was noted, unclear if this represented a       vessel or not, but given the patient's symptoms I elected to treat it.       Fulguration to ablate the lesion by argon plasma was successful without       any bleeding. There was another area of prominent mucosa with mild       erythema in close approximation but did not appear consistent with AVM       or vessel and no treatment was performed, this seemed less likely to be       a cause  of the patient's symptoms. No obvious ulcerations were noted.      The examined small bowel limb was normal without any heme or evidence of       bleeding. Impression:               - Normal esophagus.                           - Gastric bypass.                           - Mucosal changes in the anastomosis, red spot -                            unclear if this represented a vessel / AVM or not.                            Treated with argon plasma coagulation (APC).                           - Normal examined jejunum.                           Overall, no blood noted  in the bowel or evidence of                            active bleeding, but erythematous spot noted at the                            surgical anastomosis, unclear if this was a vessel                            or not, but it was treated. I'm not certain if this                            is the soure of his significant bleeding given no                            blood noted in the bowel, with right sided colon or                            small bowel bleeding also being possible. Moderate Sedation:      Moderate (conscious) sedation was administered by the endoscopy nurse       and supervised by the endoscopist. The following parameters were       monitored: oxygen saturation, heart rate, blood pressure, and response       to care. Total physician intraservice time was 35 minutes. Recommendation:           - Return patient to hospital ward for ongoing care.                           - NPO for now                           -  Continue present medications, IV PPI                           - Await course overnight, repeat CBC to ensure                            stable Hgb. If further symptoms of bleeding, will                            consider colonoscopy or repeat EGD. Tagged RBC scan                            may also be helpful if he rebleeds to help localize                            source. Procedure Code(s):        --- Professional ---                           [phone], Esophagogastroduodenoscopy, flexible,                            transoral; with ablation of tumor(s), polyp(s), or                            other lesion(s) (includes pre- and post-dilation                            and guide wire passage, when performed)                           [id number], Moderate sedation services provided by the                            same physician or other qualified health care                            professional performing the diagnostic or                            therapeutic  service that the sedation supports,                            requiring the presence of an independent trained                            observer to assist in the monitoring of the                            patient's level of consciousness and physiological                            status; initial 15 minutes of intraservice time,  patient age 82 years or older                           [phone], Moderate sedation services; each additional                            15 minutes intraservice time Diagnosis Code(s):        --- Professional ---                           [id number], Bariatric surgery status                           K31.89, Other diseases of stomach and duodenum CPT copyright 2016 [employer]. All rights reserved. The codes documented in this report are preliminary and upon coder review may  be revised to meet current compliance requirements. Viviann Spare P. Armbruster, MD 08/11/2015 4:36:27 PM This report has been signed electronically. Number of Addenda: 0

## 2015-08-11 NOTE — H&P (Signed)
History and Physical    Jabin Tapp ZOX:[id number] DOB: 08-29-1970 DOA: 08/10/2015  PCP: No primary care provider on file.   Patient coming from: Home   Chief Complaint: Bloody BM's, syncope   HPI: Walter Shields is a 45 y.o. male with medical history significant for gastric bypass in 2012 and bleeding peptic ulcer approximately 2 years ago that required transfusion, now presenting to the emergency department following a syncopal episode after his third bloody bowel movement of the day. Patient is from out of town, here for business, and reports going to bed last night in his usual state of health. Upon waking this morning, he noted nausea and generalized malaise and called in sick to work. At approximately 1:30 PM, he had a bowel movement with a large amount of dark blood. Patient went on to have 2 more bowel movements with what he describes as large amounts of dark blood mixed in with forms stool. After the third, upon standing, patient "passed out," falling and striking his head. EMS was activated for transport to the hospital. Patient denies any GI bleeding since the ulcer was treated approximately 2 years ago. He does not take a PPI or H2 blocker. He denies use of NSAIDs, aspirin, or alcohol. Aside from nausea and general malaise, patient has no particular complaints. Specifically, he denies abdominal pain or vomiting. He also denies recent fevers, chills, chest pain, palpitations, dyspnea, or cough.  ED Course: Upon arrival to the ED, patient is found to be afebrile, saturating well on room air, and with vital signs stable. EKG demonstrates sinus rhythm with early R-wave transition. Chemistry panel is notable for an elevated BUN to creatinine ratio of greater than 30. CBC is notable for hemoglobin of 7.3 and MCV of 73.5. INR is 1.16 and urinalysis is unremarkable. 1 L of normal saline was given as a bolus in the emergency departments and 2 units of packed red blood cells were ordered for  immediate transfusion. Attempts to obtain the patient's medical records are underway. Patient has remained hemodynamically stable in the emergency department and there has been no active bleeding witnessed since his arrival. Dr. [name] of [facility] kindly discussed the case with me and accepts the consultation request. Patient will be admitted to the stepdown unit for ongoing evaluation and management of syncope suspected secondary to acute GI bleed.   Review of Systems:  All other systems reviewed and apart from HPI, are negative.  Past Medical History:  Diagnosis Date  . Gastric ulcer     Past Surgical History:  Procedure Laterality Date  . GASTRIC BYPASS  10/21/2010     reports that he quit smoking about 6 years ago. His smoking use included Cigarettes. He has never used smokeless tobacco. He reports that he does not drink alcohol or use drugs.  No Known Allergies  Family History  Problem Relation Age of Onset  . Hypertension Mother   . Heart failure Mother   . Diabetes Mother   . Heart failure Father   . Hyperlipidemia Father   . Hypertension Father      Prior to Admission medications   Medication Sig Start Date End Date Taking? Authorizing Provider  ferrous sulfate 325 (65 FE) MG EC tablet Take 325 mg by mouth daily with breakfast.   Yes Historical Provider, MD  Multiple Vitamins-Minerals (MULTIVITAMIN ADULT PO) Take 1 tablet by mouth daily.   Yes Historical Provider, MD  Omega-3 Fatty Acids (FISH OIL) 1000 MG CAPS Take 1 capsule by mouth  daily.   Yes Historical Provider, MD  vitamin B-12 (CYANOCOBALAMIN) 500 MCG tablet Take 500 mcg by mouth daily.   Yes Historical Provider, MD    Physical Exam: Vitals:   08/10/15 2238 08/10/15 2300 08/10/15 2338 08/11/15 0010  BP: 128/87 129/91 110/77   Pulse: 76  80   Resp: 10 18 13    Temp:   99 F (37.2 C)   TempSrc:   Oral   SpO2: 99%  100% 100%  Weight:      Height:          Constitutional: NAD, calm, comfortable.  Pale.  Eyes: PERTLA, lids and conjunctivae normal ENMT: Mucous membranes are moist. Posterior pharynx clear of any exudate or lesions.   Neck: normal, supple, no masses, no thyromegaly Respiratory: clear to auscultation bilaterally, no wheezing, no crackles. Normal respiratory effort.   Cardiovascular: S1 & S2 heard, regular rate and rhythm, hyperdynamic precordium. No extremity edema. 2+ pedal pulses. No significant JVD. Abdomen: No distension, mild epigastric tenderness, no rebound pain or guarding, no masses palpated. Bowel sounds normal.  Musculoskeletal: no clubbing / cyanosis. No joint deformity upper and lower extremities. Normal muscle tone.  Skin: no significant rashes, lesions, ulcers. Warm, dry, well-perfused. Pale throughout.  Neurologic: CN 2-12 grossly intact. Sensation intact, DTR normal. Strength 5/5 in all 4 limbs.  Psychiatric: Normal judgment and insight. Alert and oriented x 3. Normal mood and affect.     Labs on Admission: I have personally reviewed following labs and imaging studies  CBC:  Recent Labs Lab 08/10/15 2218  WBC 10.1  HGB 7.3*  HCT 23.7*  MCV 75.5*  PLT 334   Basic Metabolic Panel:  Recent Labs Lab 08/10/15 2218  NA 138  K 4.7  CL 108  CO2 24  GLUCOSE 125*  BUN 36*  CREATININE 1.17  CALCIUM 8.5*   GFR: Estimated Creatinine Clearance: 80.5 mL/min (by C-G formula based on SCr of 1.17 mg/dL). Liver Function Tests: No results for input(s): AST, ALT, ALKPHOS, BILITOT, PROT, ALBUMIN in the last 168 hours. No results for input(s): LIPASE, AMYLASE in the last 168 hours. No results for input(s): AMMONIA in the last 168 hours. Coagulation Profile:  Recent Labs Lab 08/10/15 2138  INR 1.16   Cardiac Enzymes: No results for input(s): CKTOTAL, CKMB, CKMBINDEX, TROPONINI in the last 168 hours. BNP (last 3 results) No results for input(s): PROBNP in the last 8760 hours. HbA1C: No results for input(s): HGBA1C in the last 72  hours. CBG:  Recent Labs Lab 08/10/15 2052  GLUCAP 97   Lipid Profile: No results for input(s): CHOL, HDL, LDLCALC, TRIG, CHOLHDL, LDLDIRECT in the last 72 hours. Thyroid Function Tests: No results for input(s): TSH, T4TOTAL, FREET4, T3FREE, THYROIDAB in the last 72 hours. Anemia Panel: No results for input(s): VITAMINB12, FOLATE, FERRITIN, TIBC, IRON, RETICCTPCT in the last 72 hours. Urine analysis:    Component Value Date/Time   COLORURINE YELLOW 08/10/2015 2038   APPEARANCEUR CLEAR 08/10/2015 2038   LABSPEC 1.025 08/10/2015 2038   PHURINE 6.5 08/10/2015 2038   GLUCOSEU NEGATIVE 08/10/2015 2038   HGBUR NEGATIVE 08/10/2015 2038   BILIRUBINUR SMALL (A) 08/10/2015 2038   KETONESUR NEGATIVE 08/10/2015 2038   PROTEINUR NEGATIVE 08/10/2015 2038   NITRITE NEGATIVE 08/10/2015 2038   LEUKOCYTESUR TRACE (A) 08/10/2015 2038   Sepsis Labs: @LABRCNTIP (procalcitonin:4,lacticidven:4) )No results found for this or any previous visit (from the past 240 hour(s)).   Radiological Exams on Admission: No results found.  EKG: Independently reviewed. Sinus rhythm,  early R transition  Assessment/Plan  1. Acute GI bleed with symptomatic anemia  - With nausea, hx of gastric bypass and subsequent PUD, and elevated BUN, there is concern for UGI source  - [facility] consulting and much appreciated  - Initial Hgb 7.3 with MCV 73.5; platelets and INR wnl; outside records are currently being sought  - 1 liter of NS bolused in ED and 2 units pRBCs ordered for immediate transfusion - RN asked to place order for post-transfusion H&H  - Protonix bolus and infusion started  - No known hx of liver disease; LFT's added on to admission labs, pending  - Monitor in stepdown overnight  - Keep NPO   2. Syncope  - Occurred upon standing after pt had 3rd bloody BM of the day   - Suspected secondary to orthostasis in setting of significant GI blood-loss  - No preceding sxs to suggest a cardiac etiology and  no arrhythmia noted on telemetry monitoring in ED - Will check orthostatics, though bolus has already been given  - Will hold-off on further eval with echocardiogram as etiology seems to be well-established    3. Hx of bleeding PUD  - Pt denies use of NSAIDs or alcohol  - Does not use a PPI or H2-blocker at home  - Currently on IV PPI as above   - Outside records have been requested   4. Hx of gastric bypass  - Performed in 2012  - Outside records requested    DVT prophylaxis: SCD's  Code Status: Full   Family Communication:   Disposition Plan: Admit to stepdown Consults called: [facility] Admission status: Inpatient     Briscoe Deutscher, MD [facility] Pager [phone]  If 7PM-7AM, please contact night-coverage [email or web address] Password [email or web address]  08/11/2015, 12:20 AM

## 2015-08-11 NOTE — Consult Note (Signed)
Consultation  Referring Provider:     Dr. Antionette Char Primary Care Physician:  No primary care provider on file. Primary Gastroenterologist:     None    Reason for Consultation:     Upper GI Bleed         HPI:   Walter Shields is a 45 y.o. male with a history of obesity s/p gastric bypass around 2012 or so, with a history of PUD 2 years ago, presenting with melena. He reports he lives in [place] and was in town working yesterday when he had a black bowel movement mid day. He subsequently had 2 additional bowel movements later that afternoon, black and loose, and during the last episode he had syncope and hit his head. No nausea or vomiting. Mild abdominal pain in the upper abdomen. He states he had similar symptoms when he presented for a GI bleed 2 years ago, he reports due to an ulcer. He states he also had a colonoscopy around that time which was normal. He does endorse taking "ulcer medication" daily but unsure of which one it is, he thinks maybe prilosec. He denies taking any NSAIDs. He denies any other medical problems other than chronic iron deficiency for which he takes oral iron. Stools at baseline are brown despite taking iron. Hgb on admission was in 7s, he does not know baseline Hgb. BUN/Cr ratio elevation as well. He was given 2 units PRBC by primary service given concern for severe bleeding on admission. Last BM he states was yesterday afternoon, none since admission.   Past Medical History:  Diagnosis Date  . Gastric ulcer   obesity  Past Surgical History:  Procedure Laterality Date  . GASTRIC BYPASS  10/21/2010    Family History  Problem Relation Age of Onset  . Hypertension Mother   . Heart failure Mother   . Diabetes Mother   . Heart failure Father   . Hyperlipidemia Father   . Hypertension Father     Social History  Substance Use Topics  . Smoking status: Former Smoker    Types: Cigarettes    Quit date: 04/27/2009  . Smokeless tobacco: Never Used  . Alcohol use  No    Prior to Admission medications   Medication Sig Start Date End Date Taking? Authorizing Provider  ferrous sulfate 325 (65 FE) MG EC tablet Take 325 mg by mouth daily with breakfast.   Yes Historical Provider, MD  Multiple Vitamins-Minerals (MULTIVITAMIN ADULT PO) Take 1 tablet by mouth daily.   Yes Historical Provider, MD  Omega-3 Fatty Acids (FISH OIL) 1000 MG CAPS Take 1 capsule by mouth daily.   Yes Historical Provider, MD  vitamin B-12 (CYANOCOBALAMIN) 500 MCG tablet Take 500 mcg by mouth daily.   Yes Historical Provider, MD    Current Facility-Administered Medications  Medication Dose Route Frequency Provider Last Rate Last Dose  . 0.9 %  sodium chloride infusion   Intravenous Continuous Briscoe Deutscher, MD 75 mL/hr at 08/11/15 0431    . acetaminophen (TYLENOL) tablet 650 mg  650 mg Oral Q6H PRN Briscoe Deutscher, MD       Or  . acetaminophen (TYLENOL) suppository 650 mg  650 mg Rectal Q6H PRN Lavone Neri Opyd, MD      . cyanocobalamin tablet 500 mcg  500 mcg Oral Daily Timothy S Opyd, MD      . ferrous sulfate tablet 325 mg  325 mg Oral Q breakfast Briscoe Deutscher, MD      .  multivitamin with minerals tablet 1 tablet  1 tablet Oral Daily Lavone Neri Opyd, MD      . omega-3 acid ethyl esters (LOVAZA) capsule 1 g  1 g Oral Daily Lavone Neri Opyd, MD      . pantoprazole (PROTONIX) 80 mg in sodium chloride 0.9 % 250 mL (0.32 mg/mL) infusion  8 mg/hr Intravenous Continuous Briscoe Deutscher, MD 25 mL/hr at 08/11/15 0154 8 mg/hr at 08/11/15 0154  . [START ON 08/14/2015] pantoprazole (PROTONIX) injection 40 mg  40 mg Intravenous Q12H Timothy S Opyd, MD      . sodium chloride flush (NS) 0.9 % injection 3 mL  3 mL Intravenous Q12H Briscoe Deutscher, MD        Allergies as of 08/10/2015  . (No Known Allergies)     Review of Systems:    As per HPI, otherwise negative      Physical Exam:  Vital signs in last 24 hours: Temp:  [98.3 F (36.8 C)-99 F (37.2 C)] 98.8 F (37.1 C) (08/01  0430) Pulse Rate:  [76-104] 87 (08/01 0600) Resp:  [10-25] 17 (08/01 0600) BP: (110-159)/(74-107) 137/82 (08/01 0600) SpO2:  [97 %-100 %] 98 % (08/01 0600) Weight:  [165 lb 2 oz (74.9 kg)-175 lb (79.4 kg)] 165 lb 2 oz (74.9 kg) (08/01 0115)   General:   Pleasant in NAD Head:  Normocephalic and atraumatic. Eyes:   No icterus.   Conjunctiva pale Ears:  Normal auditory acuity. Neck:  Supple Lungs:  Respirations even and unlabored. Lungs clear to auscultation bilaterally.    Heart:  Regular rate and rhythm; no MRG Abdomen:  Soft, nondistended, nontender. Normal bowel sounds. No appreciable masses or   Rectal:  Not performed.  Msk:  Symmetrical without gross deformities.  Extremities:  Without edema. Neurologic:  Alert and  oriented x4;  grossly normal neurologically. Skin:  Intact without significant lesions or rashes. Psych:  Alert and cooperative. Normal affect.  LAB RESULTS:  Recent Labs  08/10/15 2218  WBC 10.1  HGB 7.3*  HCT 23.7*  PLT 334   BMET  Recent Labs  08/10/15 2218  NA 138  K 4.7  CL 108  CO2 24  GLUCOSE 125*  BUN 36*  CREATININE 1.17  CALCIUM 8.5*   LFT No results for input(s): PROT, ALBUMIN, AST, ALT, ALKPHOS, BILITOT, BILIDIR, IBILI in the last 72 hours. PT/INR  Recent Labs  08/10/15 2138  LABPROT 14.9  INR 1.16    STUDIES: No results found.   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 Hgb of 7s on admission, he was given PRBC transfusion given concern for severe GI bleeding / syncope and placed on IV PPI. He endorses chronic iron deficiency post gastric bypass but we don't know his baseline Hgb. He has not had any further bowel movements since admission, post-transfusion H/H pending but he is hemodynamically stable at this time and feels  improved.   Patient warrants upper endoscopy to further evaluate symptoms today. Time to be determined, likely this afternoon based on schedule however if he has symptoms of active bleeding or instability in the interim please contact us. I discussed risks / benefits of endoscopy and sedation with him and he wished to proceed. Please keep  NPO and on IV PPI for now, will await post-transfusion CBC.   Ileene Patrick, MD [facility] Pager [phone]

## 2015-08-11 NOTE — Progress Notes (Signed)
Unable to complete orthostatic vital signs while standing. Pt was able to stand to the side of the bed but began to feel lightheaded and had c/o dizziness which required the pt to sit back on the bed before BP was taken. Pt states "I can't do it". Pt assisted back to bed

## 2015-08-12 ENCOUNTER — Inpatient Hospital Stay (HOSPITAL_COMMUNITY): Payer: BLUE CROSS/BLUE SHIELD

## 2015-08-12 ENCOUNTER — Encounter (HOSPITAL_COMMUNITY): Payer: Self-pay | Admitting: Gastroenterology

## 2015-08-12 LAB — GLUCOSE, CAPILLARY: GLUCOSE-CAPILLARY: 88 mg/dL (ref 65–99)

## 2015-08-12 LAB — FOLATE RBC
Folate, Hemolysate: 439.8 ng/mL
Folate, RBC: 1825 ng/mL (ref >498)
Hematocrit: 24.1 % — ABNORMAL LOW (ref 37.5–51.0)

## 2015-08-12 LAB — CBC
HCT: 20.7 % — ABNORMAL LOW (ref 39.0–52.0)
HEMOGLOBIN: 6.7 g/dL — AB (ref 13.0–17.0)
MCH: 25.8 pg — AB (ref 26.0–34.0)
MCHC: 32.4 g/dL (ref 30.0–36.0)
MCV: 79.6 fL (ref 78.0–100.0)
PLATELETS: 236 10*3/uL (ref 150–400)
RBC: 2.6 MIL/uL — AB (ref 4.22–5.81)
RDW: 17.4 % — ABNORMAL HIGH (ref 11.5–15.5)
WBC: 9 10*3/uL (ref 4.0–10.5)

## 2015-08-12 LAB — BASIC METABOLIC PANEL
ANION GAP: 4 — AB (ref 5–15)
BUN: 38 mg/dL — AB (ref 6–20)
CALCIUM: 8.1 mg/dL — AB (ref 8.9–10.3)
CHLORIDE: 113 mmol/L — AB (ref 101–111)
CO2: 23 mmol/L (ref 22–32)
Creatinine, Ser: 1.35 mg/dL — ABNORMAL HIGH (ref 0.61–1.24)
GFR calc Af Amer: >60 mL/min (ref >60)
GLUCOSE: 102 mg/dL — AB (ref 65–99)
POTASSIUM: 4.1 mmol/L (ref 3.5–5.1)
Sodium: 140 mmol/L (ref 135–145)

## 2015-08-12 LAB — PREPARE RBC (CROSSMATCH)

## 2015-08-12 LAB — HEMOGLOBIN AND HEMATOCRIT, BLOOD
HCT: 21.8 % — ABNORMAL LOW (ref 39.0–52.0)
Hemoglobin: 7.3 g/dL — ABNORMAL LOW (ref 13.0–17.0)

## 2015-08-12 MED ORDER — SODIUM CHLORIDE 0.9 % IV SOLN: Freq: Once | INTRAVENOUS | Status: DC

## 2015-08-12 MED ORDER — TECHNETIUM TC 99M-LABELED RED BLOOD CELLS IV KIT
24.1000 | PACK | Freq: Once | INTRAVENOUS | Status: AC | PRN
  Administered 2015-08-12: 24.1 via INTRAVENOUS

## 2015-08-12 NOTE — Progress Notes (Signed)
[id number] Davis,BSN,RN3,CCM:  Gi bleed in the present of hx of gastric sleeve. No dc plan need at this time/pt lives alone but is independent in all adls.

## 2015-08-12 NOTE — Progress Notes (Signed)
Patient ID: Walter Shields, male   DOB: 26-Feb-1970, 45 y.o.   MRN: [id number]    PROGRESS NOTE    Walter Shields  YEB:[id number] DOB: [birth year] DOA: 08/10/2015  PCP: No primary care provider on file.   Brief Narrative:  45 y.o. male with known gastric bypass in 2012 and bleeding peptic ulcer approximately 2 years ago that required transfusion, now presented to the ED following a syncopal episode after his third bloody bowel movement of the day. Patient is from out of town, here for business and was in his usual state of health.   Assessment & Plan:   1. Acute GI bleed with symptomatic anemia  - Initial Hgb 7.3 with MCV 73.5; platelets and INR WNL - EGD 08/11/2015 did not show any blood in lumen of upper tract but ? visible vessel noted at surgical anastomosis, was ablated with APC. - No ulcer was appreciated - since admission he has received total 4 U PRBC - pt had another bloody bowel movement this AM and will require additional 2 units of PRBC and Hg down  8 --> 6.7 - he will remain NPO as per GI team recommendations as he may need further endoscopic evaluation   2. Syncope  - Occurred upon standing after pt had 3rd bloody BM of the day   - pt reports feeling better but has not gotten out of the bed yet - due to persistent bloody bowel movements, will hold off on PT for now   3. Hx of bleeding PUD  - Pt denies use of NSAIDs or alcohol  - Currently on IV PPI   4. Hx of gastric bypass  - Performed in 2012  - Outside records requested   5. Acute kidney injury - from GI bleed - transfusion as noted above and repeat BMP in AM  DVT prophylaxis: SCD's Code Status: Full Family Communication: Patient at bedside  Disposition Plan: home once cleared by GI  Consultants:   GI  Procedures:   EGD 8/1  Antimicrobials:   None    Subjective: Bloody BM this AM.  Objective: Vitals:   08/12/15 0649 08/12/15 0704 08/12/15 0800 08/12/15 0918  BP:  127/90 (!) 140/92 123/77    Pulse: 89 88 95 84  Resp: 16 16 18  (!) 21  Temp:  97.8 F (36.6 C) 97.8 F (36.6 C) 98.6 F (37 C)  TempSrc:  Oral Oral Oral  SpO2: 100% 100% 100% 100%  Weight:      Height:        Intake/Output Summary (Last 24 hours) at 08/12/15 0947 Last data filed at 08/12/15 0918  Gross per 24 hour  Intake             1411 ml  Output              850 ml  Net              561 ml   Filed Weights   08/11/15 0115 08/11/15 1407 08/12/15 0500  Weight: 74.9 kg (165 lb 2 oz) 74.8 kg (165 lb) 78.7 kg (173 lb 8 oz)    Examination:  General exam: Appears calm and comfortable  Respiratory system: Clear to auscultation. Respiratory effort normal. Cardiovascular system: S1 & S2 heard, RRR. No JVD, murmurs, rubs, gallops or clicks. No pedal edema. Gastrointestinal system: Abdomen is nondistended, soft and nontender. No organomegaly or masses felt.  Central nervous system: Alert and oriented. No focal neurological deficits. Extremities: Symmetric 5 x 5 power.  Psychiatry: Judgement and insight appear normal. Mood & affect appropriate.    Data Reviewed: I have personally reviewed following labs and imaging studies  CBC:  Recent Labs Lab 08/10/15 2218 08/11/15 0743 08/11/15 0934 08/11/15 1705 08/12/15 0324  WBC 10.1  --   --   --  9.0  HGB 7.3* 7.8* 7.5* 8.0* 6.7*  HCT 23.7* 23.9* 23.3* 24.7* 20.7*  MCV 75.5*  --   --   --  79.6  PLT 334  --   --   --  236   Basic Metabolic Panel:  Recent Labs Lab 08/10/15 2218 08/11/15 0743 08/12/15 0324  NA 138 140 140  K 4.7 4.4 4.1  CL 108 111 113*  CO2 GLUCOSE 125* 102* 102*  BUN 36* 36* 38*  CREATININE 1.17 1.14 1.35*  CALCIUM 8.5* 8.3* 8.1*   Liver Function Tests:  Recent Labs Lab 08/11/15 0743  AST 16  ALT 13*  ALKPHOS 38  BILITOT 1.1  PROT 5.0*  ALBUMIN 3.2*   Coagulation Profile:  Recent Labs Lab 08/10/15 2138  INR 1.16   CBG:  Recent Labs Lab 08/10/15 2052 08/11/15 0753 08/12/15 0809  GLUCAP 97 98  88   Anemia Panel:  Recent Labs  08/11/15 0743  VITAMINB12 1,285*  FERRITIN 10*  TIBC 305  IRON 77   Urine analysis:    Component Value Date/Time   COLORURINE YELLOW 08/10/2015 2038   APPEARANCEUR CLEAR 08/10/2015 2038   LABSPEC 1.025 08/10/2015 2038   PHURINE 6.5 08/10/2015 2038   GLUCOSEU NEGATIVE 08/10/2015 2038   HGBUR NEGATIVE 08/10/2015 2038   BILIRUBINUR SMALL (A) 08/10/2015 2038   KETONESUR NEGATIVE 08/10/2015 2038   PROTEINUR NEGATIVE 08/10/2015 2038   NITRITE NEGATIVE 08/10/2015 2038   LEUKOCYTESUR TRACE (A) 08/10/2015 2038   Recent Results (from the past 240 hour(s))  MRSA PCR Screening     Status: None   Collection Time: 08/11/15  1:31 AM  Result Value Ref Range Status   MRSA by PCR NEGATIVE NEGATIVE Final     Radiology Studies: No results found.  Scheduled Meds: . sodium chloride   Intravenous Once  . sodium chloride   Intravenous Once  . cyanocobalamin  500 mcg Oral Daily  . ferrous sulfate  325 mg Oral Q breakfast  . multivitamin with minerals  1 tablet Oral Daily  . omega-3 acid ethyl esters  1 g Oral Daily  . [START ON 08/14/2015] pantoprazole  40 mg Intravenous Q12H  . sodium chloride flush  3 mL Intravenous Q12H   Continuous Infusions: . pantoprozole (PROTONIX) infusion 8 mg/hr (08/11/15 2138)     LOS: 1 day    Time spent: 20 minutes    Debbora Presto, MD [facility] Pager [phone]  If 7PM-7AM, please contact night-coverage [email or web address] Password [facility] 08/12/2015, 9:47 AM

## 2015-08-12 NOTE — Progress Notes (Signed)
Progress Note   Subjective  Patient reports feeling improved and no further bowel movements since yesterday morning. EGD done as below. HGb noted to be back in 6s this AM, prompting another PRBC transfusion. Hemodynamically stable. No abdominal pains   Objective   Vital signs in last 24 hours: Temp:  [97.5 F (36.4 C)-98.9 F (37.2 C)] 97.8 F (36.6 C) (08/02 0704) Pulse Rate:  [80-127] 88 (08/02 0704) Resp:  [14-29] 16 (08/02 0704) BP: (110-170)/(75-139) 127/90 (08/02 0704) SpO2:  [97 %-100 %] 100 % (08/02 0704) Weight:  [165 lb (74.8 kg)-173 lb 8 oz (78.7 kg)] 173 lb 8 oz (78.7 kg) (08/02 0500) Last BM Date: 08/10/15 General:    white male in NAD Heart:  Regular rate and rhythm; no murmurs Lungs: Respirations even and unlabored, lungs CTA bilaterally Abdomen:  Soft, nontender and nondistended. Normal bowel sounds. Extremities:  Without edema. Neurologic:  Alert and oriented,  grossly normal neurologically. Psych:  Cooperative. Normal mood and affect.  Intake/Output from previous day: 08/01 0701 - 08/02 0700 In: 1163 [I.V.:828; Blood:335] Out: 1100 [Urine:1100] Intake/Output this shift: No intake/output data recorded.  Lab Results:  Recent Labs  08/10/15 2218  08/11/15 0934 08/11/15 1705 08/12/15 0324  WBC 10.1  --   --   --  9.0  HGB 7.3*  < > 7.5* 8.0* 6.7*  HCT 23.7*  < > 23.3* 24.7* 20.7*  PLT 334  --   --   --  236  < > = values in this interval not displayed. BMET  Recent Labs  08/10/15 2218 08/11/15 0743 08/12/15 0324  NA 138 140 140  K 4.7 4.4 4.1  CL 108 111 113*  CO2 24 24 23   GLUCOSE 125* 102* 102*  BUN 36* 36* 38*  CREATININE 1.17 1.14 1.35*  CALCIUM 8.5* 8.3* 8.1*   LFT  Recent Labs  08/11/15 0743  PROT 5.0*  ALBUMIN 3.2*  AST 16  ALT 13*  ALKPHOS 38  BILITOT 1.1  BILIDIR 0.2  IBILI 0.9   PT/INR  Recent Labs  08/10/15 2138  LABPROT 14.9  INR 1.16    Studies/Results: No results found.     Assessment /  Plan:   45 y/o male with history of gastric bypass who presented with melena and Hgb of 6s. EGD yesterday did not show any blood in the lumen of the upper tract but a red spot / possible visible vessel was noted at the surgical anastomosis and I ablated it with APC. No ulcer was appreciated. Since that time he has had worsening of his anemia prompting another transfusion however he has not had any further bowel movements at all. Unclear if this is just equilibration or if he has started to bleed again.  Overall, his symptoms were most concerning for an upper GI bleed and while I treated a possible etiology as above, there was no blood in the lumen, and raises the possibility of another source such as small bowel or right colon. He reports a colonoscopy within the past 18 months which was reportedly normal and done for iron deficiency.   We will await his transfusion this morning, repeat Hgb, and monitor his course this morning. If he has further bowel movements with evidence of active bleeding, a tagged RBC scan may be useful prior to putting him through endoscopy to help clarify the location. I discussed he may warrant repeat EGD and or colonoscopy pending his course. Please keep him NPO today and  monitor for symptoms. If he has further symptoms of active bleeding please contact me.   Walter Shields, [facility] Pager [phone]

## 2015-08-12 NOTE — Progress Notes (Addendum)
CRITICAL VALUE ALERT  Critical value received:  Hgb 6.7  Date of notification:  08/12/15  Time of notification:  0430  Critical value read back:Yes.    Nurse who received alert:  S.Adelita Hone,RN  MD notified (1st page):  K.Schorr,NP  Time of first page:  0448  MD notified (2nd page):  Time of second page:  Responding MD:  K.Schorr,NP  Time MD responded:  [id number]

## 2015-08-12 NOTE — Progress Notes (Signed)
GI UPDATE:  Tagged RBC scan negative for bleeding. Patient reports feeling well this evening. He passed a dark BM this AM but nothing since then. He had a PRBC transfusion, Hgb in 7s. Will continue to trend Hgb. Unclear if he passed old blood this morning versus re-bleed which stopped on its own. If Hgb continues to drop will need to consider repeating EGD tomorrow with consideration for colonoscopy as well. He agreed. Will reassess in the morning.   Ileene Patrick, MD [facility]

## 2015-08-13 ENCOUNTER — Encounter (HOSPITAL_COMMUNITY): Payer: Self-pay

## 2015-08-13 ENCOUNTER — Inpatient Hospital Stay (HOSPITAL_COMMUNITY): Payer: BLUE CROSS/BLUE SHIELD | Admitting: Anesthesiology

## 2015-08-13 ENCOUNTER — Encounter (HOSPITAL_COMMUNITY)
Admission: EM | Disposition: A | Discharge status: Home / Self Care | Payer: Self-pay | Source: Home / Self Care | Attending: Internal Medicine

## 2015-08-13 DIAGNOSIS — K644 Residual hemorrhoidal skin tags: Secondary | ICD-10-CM

## 2015-08-13 HISTORY — PX: ESOPHAGOGASTRODUODENOSCOPY (EGD) WITH PROPOFOL: SHX5813

## 2015-08-13 HISTORY — PX: COLONOSCOPY: SHX5424

## 2015-08-13 LAB — HEMOGLOBIN AND HEMATOCRIT, BLOOD
HCT: 23.7 % — ABNORMAL LOW (ref 39.0–52.0)
HEMOGLOBIN: 7.9 g/dL — AB (ref 13.0–17.0)

## 2015-08-13 LAB — CBC
HCT: 18.6 % — ABNORMAL LOW (ref 39.0–52.0)
HEMOGLOBIN: 6.1 g/dL — AB (ref 13.0–17.0)
MCH: 26.8 pg (ref 26.0–34.0)
MCHC: 32.8 g/dL (ref 30.0–36.0)
MCV: 81.6 fL (ref 78.0–100.0)
PLATELETS: 212 10*3/uL (ref 150–400)
RBC: 2.28 MIL/uL — ABNORMAL LOW (ref 4.22–5.81)
RDW: 17.9 % — AB (ref 11.5–15.5)
WBC: 6.7 10*3/uL (ref 4.0–10.5)

## 2015-08-13 LAB — BASIC METABOLIC PANEL
Anion gap: 3 — ABNORMAL LOW (ref 5–15)
BUN: 36 mg/dL — AB (ref 6–20)
CO2: 23 mmol/L (ref 22–32)
Calcium: 7.9 mg/dL — ABNORMAL LOW (ref 8.9–10.3)
Chloride: 112 mmol/L — ABNORMAL HIGH (ref 101–111)
Creatinine, Ser: 0.94 mg/dL (ref 0.61–1.24)
GFR calc Af Amer: >60 mL/min (ref >60)
GLUCOSE: 85 mg/dL (ref 65–99)
POTASSIUM: 3.6 mmol/L (ref 3.5–5.1)
Sodium: 138 mmol/L (ref 135–145)

## 2015-08-13 LAB — GLUCOSE, CAPILLARY: GLUCOSE-CAPILLARY: 119 mg/dL — AB (ref 65–99)

## 2015-08-13 LAB — PREPARE RBC (CROSSMATCH)

## 2015-08-13 SURGERY — ESOPHAGOGASTRODUODENOSCOPY (EGD) WITH PROPOFOL
Anesthesia: Monitor Anesthesia Care

## 2015-08-13 SURGERY — COLONOSCOPY
Anesthesia: Moderate Sedation

## 2015-08-13 MED ORDER — PROPOFOL 10 MG/ML IV BOLUS
INTRAVENOUS | Status: DC | PRN
  Administered 2015-08-13 (×4): 10 mg via INTRAVENOUS

## 2015-08-13 MED ORDER — ONDANSETRON HCL 4 MG/2ML IJ SOLN
INTRAMUSCULAR | Status: DC | PRN
  Administered 2015-08-13: 4 mg via INTRAVENOUS

## 2015-08-13 MED ORDER — LACTATED RINGERS IV SOLN
INTRAVENOUS | Status: DC | PRN
  Administered 2015-08-13: 12:00:00 via INTRAVENOUS

## 2015-08-13 MED ORDER — PEG-KCL-NACL-NASULF-NA ASC-C 100 G PO SOLR
1.0000 | Freq: Once | ORAL | Status: AC
  Administered 2015-08-13: 200 g via ORAL
  Filled 2015-08-13: qty 1

## 2015-08-13 MED ORDER — LIDOCAINE HCL (CARDIAC) 20 MG/ML IV SOLN
INTRAVENOUS | Status: AC
  Filled 2015-08-13: qty 5

## 2015-08-13 MED ORDER — FENTANYL CITRATE (PF) 100 MCG/2ML IJ SOLN
INTRAMUSCULAR | Status: AC
  Filled 2015-08-13: qty 2

## 2015-08-13 MED ORDER — LACTATED RINGERS IV SOLN
INTRAVENOUS | Status: DC
  Administered 2015-08-13: 16:00:00 via INTRAVENOUS

## 2015-08-13 MED ORDER — LIDOCAINE HCL (CARDIAC) 20 MG/ML IV SOLN
INTRAVENOUS | Status: DC | PRN
  Administered 2015-08-13: 100 mg via INTRAVENOUS

## 2015-08-13 MED ORDER — SODIUM CHLORIDE 0.9 % IV SOLN
Freq: Once | INTRAVENOUS | Status: AC
  Administered 2015-08-13: 06:00:00 via INTRAVENOUS

## 2015-08-13 MED ORDER — FENTANYL CITRATE (PF) 100 MCG/2ML IJ SOLN
INTRAMUSCULAR | Status: DC | PRN
  Administered 2015-08-13: 50 ug via INTRAVENOUS
  Administered 2015-08-13 (×2): 25 ug via INTRAVENOUS

## 2015-08-13 MED ORDER — PROPOFOL 500 MG/50ML IV EMUL
INTRAVENOUS | Status: DC | PRN
  Administered 2015-08-13: 200 ug/kg/min via INTRAVENOUS

## 2015-08-13 MED ORDER — MIDAZOLAM HCL 5 MG/ML IJ SOLN
INTRAMUSCULAR | Status: AC
  Filled 2015-08-13: qty 2

## 2015-08-13 MED ORDER — GLYCOPYRROLATE 0.2 MG/ML IJ SOLN
INTRAMUSCULAR | Status: AC
Start: 2015-08-13 — End: 2015-08-13
  Filled 2015-08-13: qty 1

## 2015-08-13 MED ORDER — PROPOFOL 10 MG/ML IV BOLUS
INTRAVENOUS | Status: AC
  Filled 2015-08-13: qty 40

## 2015-08-13 MED ORDER — MIDAZOLAM HCL 5 MG/5ML IJ SOLN
INTRAMUSCULAR | Status: DC | PRN
  Administered 2015-08-13 (×2): 2 mg via INTRAVENOUS
  Administered 2015-08-13 (×2): 1 mg via INTRAVENOUS

## 2015-08-13 MED ORDER — PEG-KCL-NACL-NASULF-NA ASC-C 100 G PO SOLR
1.0000 | Freq: Once | ORAL | Status: AC
Start: 2015-08-13 — End: 2015-08-13
  Administered 2015-08-13: 200 g via ORAL
  Filled 2015-08-13: qty 1

## 2015-08-13 SURGICAL SUPPLY — 14 items

## 2015-08-13 NOTE — Anesthesia Preprocedure Evaluation (Addendum)
Anesthesia Evaluation  Patient identified by MRN, date of birth, ID band Patient awake    Reviewed: Allergy & Precautions, NPO status , Patient's Chart, lab work & pertinent test results  Airway Mallampati: II  TM Distance: >3 FB Neck ROM: Full    Dental no notable dental hx.    Pulmonary neg pulmonary ROS, former smoker (quit 6 years ago),    Pulmonary exam normal        Cardiovascular negative cardio ROS Normal cardiovascular exam Rhythm:Regular Rate:Normal     Neuro/Psych Syncopal episode thought secondary to acute blood loss anemia negative psych ROS   GI/Hepatic Neg liver ROS, PUD, Current suspected upper GI bleed S/p gastric bypass in 2012   Endo/Other  negative endocrine ROS  Renal/GU negative Renal ROS  negative genitourinary   Musculoskeletal   Abdominal Normal abdominal exam  (+)   Peds negative pediatric ROS (+)  Hematology  (+) Blood dyscrasia, anemia , hbg 6.1, has since received 6 units PRBCs with no new episodes of hematemesis or hematochezia in last day.    Anesthesia Other Findings   Reproductive/Obstetrics                            Anesthesia Physical Anesthesia Plan  ASA: II  Anesthesia Plan: MAC   Post-op Pain Management:    Induction:   Airway Management Planned: Nasal Cannula  Additional Equipment: None  Intra-op Plan:   Post-operative Plan:   Informed Consent:   Dental advisory given  Plan Discussed with: CRNA and Anesthesiologist  Anesthesia Plan Comments:         Anesthesia Quick Evaluation

## 2015-08-13 NOTE — Op Note (Signed)
[facility] Patient Name: Walter Shields Procedure Date: 08/13/2015 MRN: [id number] Attending MD: Willaim Rayas. [name] , MD Date of Birth: 1970/09/15 CSN: [id number] Age: 45 Admit Type: Inpatient Procedure:                Colonoscopy Indications:              Gastrointestinal bleeding, melena but EGD negative                            earlier today Providers:                Willaim Rayas. [name], MD, Jacquiline Doe, RN, Oletha Blend, Technician Referring MD:              Medicines:                Fentanyl 100 micrograms IV, Midazolam 6 mg IV Complications:            No immediate complications. Estimated blood loss:                            None. Estimated Blood Loss:     Estimated blood loss: none. Procedure:                Pre-Anesthesia Assessment:                           - Prior to the procedure, a History and Physical                            was performed, and patient medications and                            allergies were reviewed. The patient's tolerance of                            previous anesthesia was also reviewed. The risks                            and benefits of the procedure and the sedation                            options and risks were discussed with the patient.                            All questions were answered, and informed consent                            was obtained. Prior Anticoagulants: The patient has                            taken no previous anticoagulant or antiplatelet                            agents. ASA  Grade Assessment: III - A patient with                            severe systemic disease. After reviewing the risks                            and benefits, the patient was deemed in                            satisfactory condition to undergo the procedure.                           After obtaining informed consent, the colonoscope                            was passed under direct  vision. Throughout the                            procedure, the patient's blood pressure, pulse, and                            oxygen saturations were monitored continuously. The                            EC-[id number] ([id number]) scope was introduced through                            the anus with the intention of advancing to the                            cecum. The scope was advanced to the sigmoid colon                            before the procedure was aborted. Medications were                            given. The colonoscopy was performed without                            difficulty. The patient tolerated the procedure                            well. The quality of the bowel preparation was poor. Scope In: 5:01:04 PM Scope Out: 5:11:02 PM Total Procedure Duration: 0 hours 9 minutes 58 seconds  Findings:      The perianal exam findings include non-thrombosed external hemorrhoids.      A large amount of dark stool mixed with dark blood was found in the       rectum, in the recto-sigmoid colon and in the sigmoid colon, precluding       visualization. Despite lavage, stool continually clogged the endoscope       and I could not visualize the lumen to safely advance the endoscope. Impression:               - Preparation  of the colon was poor.                           - Non-thrombosed external hemorrhoids found on                            perianal exam.                           - Stool in the rectum, in the recto-sigmoid colon                            and in the sigmoid colon.                           Overall, procedure aborted, suspect right colon or                            small bowel bleed Moderate Sedation:      Moderate (conscious) sedation was administered by the endoscopy nurse       and supervised by the endoscopist. The following parameters were       monitored: oxygen saturation, heart rate, blood pressure, and response       to care. Total physician intraservice  time was 15 minutes. Recommendation:           - Return patient to ICU for ongoing care.                           - NPO                           - Continue present medications.                           - Repeat bowel prep tonight to ensure adequate and                            monitor post transfusion Hgb                           - Plan for repeat colonoscopy tomorrow                           - If Hgb significantly drops overnight would                            consider a repeat tagged RBC scan to help localize                            bleeding source                           - GI service will continue to follow                           - Repeat colonoscopy tomorrow because the bowel  preparation was poor. Procedure Code(s):        --- Professional ---                           [phone], 53, Colonoscopy, flexible; diagnostic,                            including collection of specimen(s) by brushing or                            washing, when performed (separate procedure)                           99152, Moderate sedation services provided by the                            same physician or other qualified health care                            professional performing the diagnostic or                            therapeutic service that the sedation supports,                            requiring the presence of an independent trained                            observer to assist in the monitoring of the                            patient's level of consciousness and physiological                            status; initial 15 minutes of intraservice time,                            patient age 45 years or older Diagnosis Code(s):        --- Professional ---                           K64.4, Residual hemorrhoidal skin tags                           K92.2, Gastrointestinal hemorrhage, unspecified CPT copyright 2016 [employer] Medical Association. All rights  reserved. The codes documented in this report are preliminary and upon coder review may  be revised to meet current compliance requirements. Viviann Spare P. Eddy Termine, MD 08/13/2015 5:20:27 PM This report has been signed electronically. Number of Addenda: 0

## 2015-08-13 NOTE — Anesthesia Postprocedure Evaluation (Signed)
Anesthesia Post Note  Patient: Walter Shields  Procedure(s) Performed: Procedure(s) (LRB): ESOPHAGOGASTRODUODENOSCOPY (EGD) WITH PROPOFOL (N/A)  Patient location during evaluation: PACU Anesthesia Type: MAC Level of consciousness: awake and alert Pain management: pain level controlled Vital Signs Assessment: post-procedure vital signs reviewed and stable Respiratory status: spontaneous breathing, nonlabored ventilation, respiratory function stable and patient connected to nasal cannula oxygen Cardiovascular status: stable and blood pressure returned to baseline Anesthetic complications: no    Last Vitals:  Vitals:   08/13/15 1157 08/13/15 1200  BP: (!) 146/98 (!) 163/101  Pulse: 81   Resp: 19   Temp: 36.8 C     Last Pain:  Vitals:   08/13/15 1157  TempSrc: Oral  PainSc:                  Kennieth Rad

## 2015-08-13 NOTE — Interval H&P Note (Signed)
History and Physical Interval Note:  08/13/2015 11:23 AM  Walter Shields  has presented today for surgery, with the diagnosis of GI bleed  The various methods of treatment have been discussed with the patient and family. After consideration of risks, benefits and other options for treatment, the patient has consented to  Procedure(s): ESOPHAGOGASTRODUODENOSCOPY (EGD) WITH PROPOFOL (N/A) as a surgical intervention .  The patient's history has been reviewed, patient examined, no change in status, stable for surgery.  I have reviewed the patient's chart and labs.  Questions were answered to the patient's satisfaction.     Reeves Forth Adrie Picking

## 2015-08-13 NOTE — Progress Notes (Signed)
GI UPDATE:  Patient underwent repeat EGD this AM in which no active bleeding was noted and no blood in the bowel. He passed more blood with downtrend Hgb and required another transfusion. We purge prepped him for colonoscopy this evening, unfortunately his prep was not adequate with stool and old blood mixed in the colon, but could not advance the scope due to solid stool / clot. Suspect right colon or small bowel bleed at this point. I have ordered more prep for him this evening and otherwise keep him NPO with post transfusion Hgb to be scheduled and trended. If he has downtrend in Hgb or passes a significant amount of blood overnight, a repeat bleeding scan would be useful. Please contact the on call physician in this scenario. We will otherwise reassess him in the morning for planned colonoscopy.   Ileene Patrick, MD [facility] Pager [phone]

## 2015-08-13 NOTE — Progress Notes (Addendum)
Patient ID: Walter Shields, male   DOB: 1970/07/16, 45 y.o.   MRN: [id number]    PROGRESS NOTE    Dontarious Branom  UGQ:[id number] DOB: [birth year] DOA: 08/10/2015  PCP: No primary care provider on file.   Brief Narrative:  45 y.o. male with known gastric bypass in 2012 and bleeding peptic ulcer approximately 2 years ago that required transfusion, now presented to the ED following a syncopal episode after his third bloody bowel movement of the day. Patient is from out of town, here for business and was in his usual state of health.   Assessment & Plan:   1. Acute blood loss anemia/ acute GI bleed with symptomatic anemia  - Initial Hgb 7.3 with MCV 73.5; platelets and INR WNL - EGD 08/11/2015 did not show any blood in lumen of upper tract but ? visible vessel noted at surgical anastomosis, was ablated with APC. - No ulcer was appreciated - Patient requires additional blood transfusions as hemoglobin is down again to 6.1 - Bleeding scan negative - Appreciate GI team recommendations and assistance  2. Syncope  - pt reports feeling better but has not gotten out of the bed yet - Holding off on PT evaluation until bleeding resolved  3. Hx of bleeding PUD  - Pt denies use of NSAIDs or alcohol  - Currently on IV PPI   4. Hx of gastric bypass  - Performed in 2012  - Outside records requested   5. Acute kidney injury - from GI bleed - Creatinine now within normal limits  DVT prophylaxis: SCD's Code Status: Full Family Communication: Patient at bedside  Disposition Plan: home once cleared by GI  Consultants:   GI  Procedures:   EGD 8/1  Antimicrobials:   None    Subjective: No bloody BM's this AM.   Objective: Vitals:   08/13/15 0800 08/13/15 0833 08/13/15 0902 08/13/15 1041  BP: 115/71  112/76   Pulse: 72     Resp: 12     Temp: 98.8 F (37.1 C) 98 F (36.7 C) 98.7 F (37.1 C) 98.6 F (37 C)  TempSrc: Oral Oral Oral Oral  SpO2: 99%     Weight:      Height:         Intake/Output Summary (Last 24 hours) at 08/13/15 1048 Last data filed at 08/13/15 0902  Gross per 24 hour  Intake             1275 ml  Output              975 ml  Net              300 ml   Filed Weights   08/11/15 1407 08/12/15 0500 08/13/15 0400  Weight: 74.8 kg (165 lb) 78.7 kg (173 lb 8 oz) 76.7 kg (169 lb 1.5 oz)    Examination:  General exam: Appears calm and comfortable  Respiratory system: Clear to auscultation. Respiratory effort normal. Cardiovascular system: S1 & S2 heard, RRR. No JVD, murmurs, rubs, gallops or clicks. No pedal edema. Gastrointestinal system: Abdomen is nondistended, soft and nontender. No organomegaly or masses felt.  Central nervous system: Alert and oriented. No focal neurological deficits. Extremities: Symmetric 5 x 5 power. Psychiatry: Judgement and insight appear normal. Mood & affect appropriate.    Data Reviewed: I have personally reviewed following labs and imaging studies  CBC:  Recent Labs Lab 08/10/15 2218  08/11/15 0934 08/11/15 1705 08/12/15 0324 08/12/15 1709 08/13/15 0353  WBC 10.1  --   --   --  9.0  --  6.7  HGB 7.3*  < > 7.5* 8.0* 6.7* 7.3* 6.1*  HCT 23.7*  < > 23.3* 24.7* 20.7* 21.8* 18.6*  MCV 75.5*  --   --   --  79.6  --  81.6  PLT 334  --   --   --  236  --  212  < > = values in this interval not displayed. Basic Metabolic Panel:  Recent Labs Lab 08/10/15 2218 08/11/15 0743 08/12/15 0324 08/13/15 0353  NA 138 140 140 138  K 4.7 4.4 4.1 3.6  CL 108 111 113* 112*  CO2 24 24 23 23   GLUCOSE 125* 102* 102* 85  BUN 36* 36* 38* 36*  CREATININE 1.17 1.14 1.35* 0.94  CALCIUM 8.5* 8.3* 8.1* 7.9*   Liver Function Tests:  Recent Labs Lab 08/11/15 0743  AST 16  ALT 13*  ALKPHOS 38  BILITOT 1.1  PROT 5.0*  ALBUMIN 3.2*   Coagulation Profile:  Recent Labs Lab 08/10/15 2138  INR 1.16   CBG:  Recent Labs Lab 08/10/15 2052 08/11/15 0753 08/12/15 0809 08/13/15 0932  GLUCAP 97 98 88 119*    Anemia Panel:  Recent Labs  08/11/15 0743  VITAMINB12 1,285*  FERRITIN 10*  TIBC 305  IRON 77   Urine analysis:    Component Value Date/Time   COLORURINE YELLOW 08/10/2015 2038   APPEARANCEUR CLEAR 08/10/2015 2038   LABSPEC 1.025 08/10/2015 2038   PHURINE 6.5 08/10/2015 2038   GLUCOSEU NEGATIVE 08/10/2015 2038   HGBUR NEGATIVE 08/10/2015 2038   BILIRUBINUR SMALL (A) 08/10/2015 2038   KETONESUR NEGATIVE 08/10/2015 2038   PROTEINUR NEGATIVE 08/10/2015 2038   NITRITE NEGATIVE 08/10/2015 2038   LEUKOCYTESUR TRACE (A) 08/10/2015 2038   Recent Results (from the past 240 hour(s))  MRSA PCR Screening     Status: None   Collection Time: 08/11/15  1:31 AM  Result Value Ref Range Status   MRSA by PCR NEGATIVE NEGATIVE Final     Radiology Studies: Nm Gi Blood Loss  Result Date: 08/12/2015 CLINICAL DATA:  Bloody stools since 08/10/2015, anemia, hemoglobin = 6.7 g/dL EXAM: NUCLEAR MEDICINE GASTROINTESTINAL BLEEDING SCAN TECHNIQUE: Sequential abdominal images were obtained following intravenous administration of Tc-27m labeled red blood cells. RADIOPHARMACEUTICALS:  24.1 mCi Tc-23m in-vitro labeled autologous red cells IV. COMPARISON:  None FINDINGS: Imaging performed for 2 hours. Significant patient motion artifacts throughout exam. Normal blood pool distribution of labeled erythrocytes. Small amount of excreted de labeled technetium into urinary bladder. No abnormal gastrointestinal localization of labeled erythrocytes identified to suggest active GI bleeding. IMPRESSION: Negative GI bleeding study. Electronically Signed   By: Ulyses Southward M.D.   On: 08/12/2015 16:45    Scheduled Meds: . [MAR Hold] sodium chloride   Intravenous Once  . [MAR Hold] sodium chloride   Intravenous Once  . [MAR Hold] cyanocobalamin  500 mcg Oral Daily  . [MAR Hold] ferrous sulfate  325 mg Oral Q breakfast  . [MAR Hold] multivitamin with minerals  1 tablet Oral Daily  . [MAR Hold] omega-3 acid ethyl esters   1 g Oral Daily  . [MAR Hold] pantoprazole  40 mg Intravenous Q12H  . [MAR Hold] sodium chloride flush  3 mL Intravenous Q12H   Continuous Infusions: . pantoprozole (PROTONIX) infusion 8 mg/hr (08/13/15 0614)     LOS: 2 days    Time spent: 20 minutes   Debbora Presto, MD [facility] Pager [phone]  If 7PM-7AM, please contact night-coverage [email or web address] Password [facility] 08/13/2015,  10:48 AM

## 2015-08-13 NOTE — H&P (View-Only) (Signed)
Patient ID: Walter Shields, male   DOB: [birth year], 45 y.o.   MRN: [id number]    Progress Note   Subjective   Feels fine, no complaints . No stools last evening or this am  HGB down to 6.1 again- being transfused Bleeding scan negative yesterday    Objective   Vital signs in last 24 hours: Temp:  [98 F (36.7 C)-99.3 F (37.4 C)] 98.7 F (37.1 C) (08/03 0902) Pulse Rate:  [72-85] 72 (08/03 0800) Resp:  [12-26] 12 (08/03 0800) BP: (104-144)/(62-97) 112/76 (08/03 0902) SpO2:  [98 %-100 %] 99 % (08/03 0800) Weight:  [169 lb 1.5 oz (76.7 kg)] 169 lb 1.5 oz (76.7 kg) (08/03 0400) Last BM Date: 08/12/15 General:    white male in NAD Heart:  Regular rate and rhythm; no murmurs Lungs: Respirations even and unlabored, lungs CTA bilaterally Abdomen:  Soft, nontender and nondistended. Normal bowel sounds. Extremities:  Without edema. Neurologic:  Alert and oriented,  grossly normal neurologically. Psych:  Cooperative. Normal mood and affect.  Intake/Output from previous day: 08/02 0701 - 08/03 0700 In: 898 [I.V.:600; Blood:298] Out: 725 [Urine:725] Intake/Output this shift: Total I/O In: 675 [Blood:675] Out: 250 [Urine:250]  Lab Results:  Recent Labs  08/10/15 2218  08/12/15 0324 08/12/15 1709 08/13/15 0353  WBC 10.1  --  9.0  --  6.7  HGB 7.3*  < > 6.7* 7.3* 6.1*  HCT 23.7*  < > 20.7* 21.8* 18.6*  PLT 334  --  236  --  212  < > = values in this interval not displayed. BMET  Recent Labs  08/11/15 0743 08/12/15 0324 08/13/15 0353  NA 140 140 138  K 4.4 4.1 3.6  CL 111 113* 112*  CO2 24 23 23   GLUCOSE 102* 102* 85  BUN 36* 38* 36*  CREATININE 1.14 1.35* 0.94  CALCIUM 8.3* 8.1* 7.9*   LFT  Recent Labs  08/11/15 0743  PROT 5.0*  ALBUMIN 3.2*  AST 16  ALT 13*  ALKPHOS 38  BILITOT 1.1  BILIDIR 0.2  IBILI 0.9   PT/INR  Recent Labs  08/10/15 2138  LABPROT 14.9  INR 1.16    Studies/Results: Nm Gi Blood Loss  Result Date: 08/12/2015 CLINICAL  DATA:  Bloody stools since 08/10/2015, anemia, hemoglobin = 6.7 g/dL EXAM: NUCLEAR MEDICINE GASTROINTESTINAL BLEEDING SCAN TECHNIQUE: Sequential abdominal images were obtained following intravenous administration of Tc-8m labeled red blood cells. RADIOPHARMACEUTICALS:  24.1 mCi Tc-64m in-vitro labeled autologous red cells IV. COMPARISON:  None FINDINGS: Imaging performed for 2 hours. Significant patient motion artifacts throughout exam. Normal blood pool distribution of labeled erythrocytes. Small amount of excreted de labeled technetium into urinary bladder. No abnormal gastrointestinal localization of labeled erythrocytes identified to suggest active GI bleeding. IMPRESSION: Negative GI bleeding study. Electronically Signed   By: Ulyses Southward M.D.   On: 08/12/2015 16:45       Assessment / Plan:    #1 45 yo WM  S/p  Roux en Y  Gastric bypass with acute GI Bleed of unclear etiology Bleeding scan negative yesterday  Continues to require transfusions EGD 8/1 with erythematous spot at anastomosis otherwise negative  Will proceed with repeat EGD today this am, if negative then purge bowel for Colonoscopy Consider Meckels scan  Continue serial hgb's and keep hgb above 7   Principal Problem:   Acute GI bleeding Active Problems:   Syncope   Symptomatic anemia   History of bleeding peptic ulcer   Lower GI bleeding  LOS: 2 days   Linzy Darling  08/13/2015, 9:31 AM

## 2015-08-13 NOTE — Interval H&P Note (Signed)
History and Physical Interval Note:  08/13/2015 4:27 PM  Walter Shields  has presented today for surgery, with the diagnosis of GI bleeding  The various methods of treatment have been discussed with the patient and family. After consideration of risks, benefits and other options for treatment, the patient has consented to  Procedure(s): COLONOSCOPY (N/A) as a surgical intervention .  The patient's history has been reviewed, patient examined, no change in status, stable for surgery.  I have reviewed the patient's chart and labs.  Questions were answered to the patient's satisfaction.     [name]

## 2015-08-13 NOTE — Op Note (Signed)
[facility] Patient Name: Walter Shields Procedure Date: 08/13/2015 MRN: [id number] Attending MD: Willaim Rayas. [name] , MD Date of Birth: 01-21-1970 CSN: [id number] Age: 45 Admit Type: Inpatient Procedure:                Upper GI endoscopy Indications:              Melena, history of gastric bypass. Last EGD showed                            erythematous spot at anastomosis treated with APC                            however no obvious stigmata of bleeding. Patient                            with recurrent melena and anemia. Providers:                Willaim Rayas. [name], MD, Jacquiline Doe, RN, Lorenda Ishihara, Technician, Maricela Curet, CRNA Referring MD:              Medicines:                Monitored Anesthesia Care Complications:            No immediate complications. Estimated blood loss:                            None. Estimated Blood Loss:     Estimated blood loss: none. Procedure:                Pre-Anesthesia Assessment:                           - Prior to the procedure, a History and Physical                            was performed, and patient medications and                            allergies were reviewed. The patient's tolerance of                            previous anesthesia was also reviewed. The risks                            and benefits of the procedure and the sedation                            options and risks were discussed with the patient.                            All questions were answered, and informed consent  was obtained. Prior Anticoagulants: The patient has                            taken no previous anticoagulant or antiplatelet                            agents. ASA Grade Assessment: III - A patient with                            severe systemic disease. After reviewing the risks                            and benefits, the patient was deemed in   satisfactory condition to undergo the procedure.                           After obtaining informed consent, the endoscope was                            passed under direct vision. Throughout the                            procedure, the patient's blood pressure, pulse, and                            oxygen saturations were monitored continuously. The                            EG-[id number] ([id number]) scope was introduced through the                            mouth, and advanced to the jejunum. The upper GI                            endoscopy was accomplished without difficulty. The                            patient tolerated the procedure well. Scope In: Scope Out: Findings:      The examined esophagus was normal.      Evidence of a gastric bypass was found. A gastric pouch was found and       was normal. The anastomosis generally appeared healthy, the previously       noted red spot that was treated with APC was noted and had some expected       post treatment erythema but there was no evidence of bleeding. No       stigmata of bleeding noted. No heme noted in the gastric pouch.      The examined jejunum was normal and was deeply intubated as far as the       endoscope could reach. No heme noted. Impression:               - Normal esophagus.                           - Gastric bypass with  changes as above                           - Normal examined jejunum.                           No obvious pathology to account for the patient's                            bleeding. Previously noted and treated red spot at                            the anastomosis had no stigmata of bleeding. No                            heme to suggest recent bleeding. Moderate Sedation:      No moderate sedation, case performed with MAC Recommendation:           - Return patient to hospital ward for ongoing care.                           - NPO                           - Plan for bowel prep now with colonoscopy  later                            today if patient can prep in time.                           - Continue present medications                           - Serial Hgb with repeat transfusion PRN Procedure Code(s):        --- Professional ---                           [phone], Esophagogastroduodenoscopy, flexible,                            transoral; diagnostic, including collection of                            specimen(s) by brushing or washing, when performed                            (separate procedure) Diagnosis Code(s):        --- Professional ---                           N56.21, Bariatric surgery status                           K92.1, Melena (includes Hematochezia) CPT copyright 2016 [employer]. All rights reserved. The codes documented in this report are preliminary and upon coder review may  be revised to meet current  compliance requirements. Viviann Spare P. Zhania Shaheen, MD 08/13/2015 12:02:43 PM This report has been signed electronically. Number of Addenda: 0

## 2015-08-13 NOTE — Progress Notes (Signed)
Patient ID: Walter Shields, male   DOB: [birth year], 45 y.o.   MRN: [id number]    Progress Note   Subjective   Feels fine, no complaints . No stools last evening or this am  HGB down to 6.1 again- being transfused Bleeding scan negative yesterday    Objective   Vital signs in last 24 hours: Temp:  [98 F (36.7 C)-99.3 F (37.4 C)] 98.7 F (37.1 C) (08/03 0902) Pulse Rate:  [72-85] 72 (08/03 0800) Resp:  [12-26] 12 (08/03 0800) BP: (104-144)/(62-97) 112/76 (08/03 0902) SpO2:  [98 %-100 %] 99 % (08/03 0800) Weight:  [169 lb 1.5 oz (76.7 kg)] 169 lb 1.5 oz (76.7 kg) (08/03 0400) Last BM Date: 08/12/15 General:    white male in NAD Heart:  Regular rate and rhythm; no murmurs Lungs: Respirations even and unlabored, lungs CTA bilaterally Abdomen:  Soft, nontender and nondistended. Normal bowel sounds. Extremities:  Without edema. Neurologic:  Alert and oriented,  grossly normal neurologically. Psych:  Cooperative. Normal mood and affect.  Intake/Output from previous day: 08/02 0701 - 08/03 0700 In: 898 [I.V.:600; Blood:298] Out: 725 [Urine:725] Intake/Output this shift: Total I/O In: 675 [Blood:675] Out: 250 [Urine:250]  Lab Results:  Recent Labs  08/10/15 2218  08/12/15 0324 08/12/15 1709 08/13/15 0353  WBC 10.1  --  9.0  --  6.7  HGB 7.3*  < > 6.7* 7.3* 6.1*  HCT 23.7*  < > 20.7* 21.8* 18.6*  PLT 334  --  236  --  212  < > = values in this interval not displayed. BMET  Recent Labs  08/11/15 0743 08/12/15 0324 08/13/15 0353  NA 140 140 138  K 4.4 4.1 3.6  CL 111 113* 112*  CO2 24 23 23   GLUCOSE 102* 102* 85  BUN 36* 38* 36*  CREATININE 1.14 1.35* 0.94  CALCIUM 8.3* 8.1* 7.9*   LFT  Recent Labs  08/11/15 0743  PROT 5.0*  ALBUMIN 3.2*  AST 16  ALT 13*  ALKPHOS 38  BILITOT 1.1  BILIDIR 0.2  IBILI 0.9   PT/INR  Recent Labs  08/10/15 2138  LABPROT 14.9  INR 1.16    Studies/Results: Nm Gi Blood Loss  Result Date: 08/12/2015 CLINICAL  DATA:  Bloody stools since 08/10/2015, anemia, hemoglobin = 6.7 g/dL EXAM: NUCLEAR MEDICINE GASTROINTESTINAL BLEEDING SCAN TECHNIQUE: Sequential abdominal images were obtained following intravenous administration of Tc-8m labeled red blood cells. RADIOPHARMACEUTICALS:  24.1 mCi Tc-64m in-vitro labeled autologous red cells IV. COMPARISON:  None FINDINGS: Imaging performed for 2 hours. Significant patient motion artifacts throughout exam. Normal blood pool distribution of labeled erythrocytes. Small amount of excreted de labeled technetium into urinary bladder. No abnormal gastrointestinal localization of labeled erythrocytes identified to suggest active GI bleeding. IMPRESSION: Negative GI bleeding study. Electronically Signed   By: Ulyses Southward M.D.   On: 08/12/2015 16:45       Assessment / Plan:    #1 45 yo WM  S/p  Roux en Y  Gastric bypass with acute GI Bleed of unclear etiology Bleeding scan negative yesterday  Continues to require transfusions EGD 8/1 with erythematous spot at anastomosis otherwise negative  Will proceed with repeat EGD today this am, if negative then purge bowel for Colonoscopy Consider Meckels scan  Continue serial hgb's and keep hgb above 7   Principal Problem:   Acute GI bleeding Active Problems:   Syncope   Symptomatic anemia   History of bleeding peptic ulcer   Lower GI bleeding  LOS: 2 days   Amy Esterwood  08/13/2015, 9:31 AM  

## 2015-08-13 NOTE — Progress Notes (Signed)
CRITICAL VALUE ALERT  Critical value received:  Hgb 6.1  Date of notification:  08/13/15  Time of notification:  0420  Critical value read back:Yes.    Nurse who received alert:  Lelon Mast, RN  MD notified (1st page):  Merdis Delay, NP   Time of first page:  0424  MD notified (2nd page):  Time of second page:  Responding MD:  Merdis Delay, NP  Time MD responded:  [phone]

## 2015-08-13 NOTE — Transfer of Care (Signed)
Immediate Anesthesia Transfer of Care Note  Patient: Walter Shields  Procedure(s) Performed: Procedure(s): ESOPHAGOGASTRODUODENOSCOPY (EGD) WITH PROPOFOL (N/A)  Patient Location: PACU  Anesthesia Type:MAC  Level of Consciousness:  sedated, patient cooperative and responds to stimulation  Airway & Oxygen Therapy:Patient Spontanous Breathing and Patient connected to face mask oxgen  Post-op Assessment:  Report given to PACU RN and Post -op Vital signs reviewed and stable  Post vital signs:  Reviewed and stable  Last Vitals:  Vitals:   08/13/15 1110 08/13/15 1157  BP: 122/81 (!) 146/98  Pulse: 74 81  Resp: 20 19  Temp: 37.1 C     Complications: No apparent anesthesia complications

## 2015-08-14 ENCOUNTER — Inpatient Hospital Stay (HOSPITAL_COMMUNITY): Payer: BLUE CROSS/BLUE SHIELD | Admitting: Anesthesiology

## 2015-08-14 ENCOUNTER — Encounter (HOSPITAL_COMMUNITY): Payer: Self-pay | Admitting: Gastroenterology

## 2015-08-14 ENCOUNTER — Encounter (HOSPITAL_COMMUNITY)
Admission: EM | Disposition: A | Discharge status: Home / Self Care | Payer: Self-pay | Source: Home / Self Care | Attending: Internal Medicine

## 2015-08-14 ENCOUNTER — Inpatient Hospital Stay (HOSPITAL_COMMUNITY): Payer: BLUE CROSS/BLUE SHIELD

## 2015-08-14 DIAGNOSIS — K922 Gastrointestinal hemorrhage, unspecified: Secondary | ICD-10-CM

## 2015-08-14 DIAGNOSIS — J9600 Acute respiratory failure, unspecified whether with hypoxia or hypercapnia: Secondary | ICD-10-CM

## 2015-08-14 HISTORY — PX: COLONOSCOPY: SHX5424

## 2015-08-14 HISTORY — PX: LAPAROSCOPIC ABDOMINAL EXPLORATION: SHX6249

## 2015-08-14 HISTORY — PX: LAPAROTOMY: SHX154

## 2015-08-14 LAB — BLOOD GAS, ARTERIAL
ACID-BASE DEFICIT: 3.5 mmol/L — AB (ref 0.0–2.0)
Bicarbonate: 21.6 mEq/L (ref 20.0–24.0)
DRAWN BY: 40415
FIO2: 0.5
MECHVT: 520 mL
O2 Saturation: 99.5 %
PEEP/CPAP: 5 cmH2O
PO2 ART: 209 mmHg — AB (ref 80.0–100.0)
Patient temperature: 37
RATE: 16 resp/min
TCO2: 20.8 mmol/L (ref 0–100)
pCO2 arterial: 42.6 mmHg (ref 35.0–45.0)
pH, Arterial: 7.326 — ABNORMAL LOW (ref 7.350–7.450)

## 2015-08-14 LAB — TYPE AND SCREEN
ABO/RH(D): O POS
Antibody Screen: NEGATIVE
UNIT DIVISION: 0
UNIT DIVISION: 0
UNIT DIVISION: 0
Unit division: 0
Unit division: 0
Unit division: 0

## 2015-08-14 LAB — BASIC METABOLIC PANEL
Anion gap: 8 (ref 5–15)
BUN: 21 mg/dL — AB (ref 6–20)
CHLORIDE: 107 mmol/L (ref 101–111)
CO2: 23 mmol/L (ref 22–32)
CREATININE: 0.94 mg/dL (ref 0.61–1.24)
Calcium: 8.1 mg/dL — ABNORMAL LOW (ref 8.9–10.3)
GFR calc Af Amer: >60 mL/min (ref >60)
GFR calc non Af Amer: >60 mL/min (ref >60)
GLUCOSE: 79 mg/dL (ref 65–99)
POTASSIUM: 3.8 mmol/L (ref 3.5–5.1)
SODIUM: 138 mmol/L (ref 135–145)

## 2015-08-14 LAB — POCT I-STAT 4, (NA,K, GLUC, HGB,HCT)
Glucose, Bld: 93 mg/dL (ref 65–99)
HEMATOCRIT: 25 % — AB (ref 39.0–52.0)
HEMOGLOBIN: 8.5 g/dL — AB (ref 13.0–17.0)
Potassium: 4.2 mmol/L (ref 3.5–5.1)
Sodium: 142 mmol/L (ref 135–145)

## 2015-08-14 LAB — HEMOGLOBIN AND HEMATOCRIT, BLOOD
HCT: 28.1 % — ABNORMAL LOW (ref 39.0–52.0)
HEMATOCRIT: 25.9 % — AB (ref 39.0–52.0)
HEMATOCRIT: 27.7 % — AB (ref 39.0–52.0)
HEMOGLOBIN: 8.5 g/dL — AB (ref 13.0–17.0)
HEMOGLOBIN: 9.2 g/dL — AB (ref 13.0–17.0)
HEMOGLOBIN: 9.3 g/dL — AB (ref 13.0–17.0)

## 2015-08-14 LAB — CBC
HEMATOCRIT: 24.5 % — AB (ref 39.0–52.0)
HEMOGLOBIN: 8.1 g/dL — AB (ref 13.0–17.0)
MCH: 27.3 pg (ref 26.0–34.0)
MCHC: 33.1 g/dL (ref 30.0–36.0)
MCV: 82.5 fL (ref 78.0–100.0)
Platelets: 263 10*3/uL (ref 150–400)
RBC: 2.97 MIL/uL — AB (ref 4.22–5.81)
RDW: 17.8 % — ABNORMAL HIGH (ref 11.5–15.5)
WBC: 10.2 10*3/uL (ref 4.0–10.5)

## 2015-08-14 LAB — GLUCOSE, CAPILLARY: GLUCOSE-CAPILLARY: 81 mg/dL (ref 65–99)

## 2015-08-14 SURGERY — LAPAROTOMY, EXPLORATORY
Anesthesia: General

## 2015-08-14 SURGERY — COLONOSCOPY
Anesthesia: Monitor Anesthesia Care

## 2015-08-14 MED ORDER — PROPOFOL 10 MG/ML IV BOLUS
INTRAVENOUS | Status: AC
  Filled 2015-08-14: qty 20

## 2015-08-14 MED ORDER — ONDANSETRON HCL 4 MG/2ML IJ SOLN
INTRAMUSCULAR | Status: AC
  Filled 2015-08-14: qty 2

## 2015-08-14 MED ORDER — ROCURONIUM BROMIDE 100 MG/10ML IV SOLN
INTRAVENOUS | Status: DC | PRN
  Administered 2015-08-14: 20 mg via INTRAVENOUS
  Administered 2015-08-14: 30 mg via INTRAVENOUS
  Administered 2015-08-14 (×2): 20 mg via INTRAVENOUS
  Administered 2015-08-14: 10 mg via INTRAVENOUS

## 2015-08-14 MED ORDER — FENTANYL CITRATE (PF) 100 MCG/2ML IJ SOLN
INTRAMUSCULAR | Status: DC | PRN
  Administered 2015-08-14: 50 ug via INTRAVENOUS

## 2015-08-14 MED ORDER — ALBUMIN HUMAN 5 % IV SOLN
INTRAVENOUS | Status: AC
  Filled 2015-08-14: qty 500

## 2015-08-14 MED ORDER — FENTANYL CITRATE (PF) 250 MCG/5ML IJ SOLN
INTRAMUSCULAR | Status: AC
  Filled 2015-08-14: qty 5

## 2015-08-14 MED ORDER — SUCCINYLCHOLINE CHLORIDE 20 MG/ML IJ SOLN
INTRAMUSCULAR | Status: DC | PRN
  Administered 2015-08-14: 100 mg via INTRAVENOUS

## 2015-08-14 MED ORDER — ONDANSETRON HCL 4 MG/2ML IJ SOLN
INTRAMUSCULAR | Status: DC | PRN
  Administered 2015-08-14: 4 mg via INTRAVENOUS

## 2015-08-14 MED ORDER — HYDROMORPHONE HCL 1 MG/ML IJ SOLN
1.0000 mg | INTRAMUSCULAR | Status: DC | PRN
  Administered 2015-08-14 (×4): 1 mg via INTRAVENOUS
  Filled 2015-08-14 (×4): qty 1

## 2015-08-14 MED ORDER — LACTATED RINGERS IV SOLN
INTRAVENOUS | Status: DC
  Administered 2015-08-16 (×2): via INTRAVENOUS

## 2015-08-14 MED ORDER — PIPERACILLIN-TAZOBACTAM 3.375 G IVPB 30 MIN
3.3750 g | Freq: Once | INTRAVENOUS | Status: AC
  Administered 2015-08-14: 3.375 g via INTRAVENOUS

## 2015-08-14 MED ORDER — LACTATED RINGERS IR SOLN
Status: DC | PRN
  Administered 2015-08-14: 1000 mL

## 2015-08-14 MED ORDER — IOPAMIDOL (ISOVUE-370) INJECTION 76%
100.0000 mL | Freq: Once | INTRAVENOUS | Status: AC | PRN
  Administered 2015-08-14: 100 mL via INTRAVENOUS

## 2015-08-14 MED ORDER — PROPOFOL 500 MG/50ML IV EMUL
INTRAVENOUS | Status: DC | PRN
  Administered 2015-08-14: 150 ug/kg/min via INTRAVENOUS

## 2015-08-14 MED ORDER — FENTANYL CITRATE (PF) 100 MCG/2ML IJ SOLN
25.0000 ug | INTRAMUSCULAR | Status: DC | PRN
  Administered 2015-08-14 (×2): 50 ug via INTRAVENOUS

## 2015-08-14 MED ORDER — PROPOFOL 1000 MG/100ML IV EMUL
0.0000 ug/kg/min | INTRAVENOUS | Status: DC
  Administered 2015-08-14: 10 ug/kg/min via INTRAVENOUS
  Administered 2015-08-15 – 2015-08-16 (×5): 20 ug/kg/min via INTRAVENOUS
  Filled 2015-08-14 (×6): qty 100

## 2015-08-14 MED ORDER — LIDOCAINE HCL (CARDIAC) 20 MG/ML IV SOLN
INTRAVENOUS | Status: AC
Start: 2015-08-14 — End: 2015-08-14
  Filled 2015-08-14: qty 5

## 2015-08-14 MED ORDER — PHENYLEPHRINE HCL 10 MG/ML IJ SOLN
INTRAMUSCULAR | Status: DC | PRN
  Administered 2015-08-14 (×5): 80 ug via INTRAVENOUS

## 2015-08-14 MED ORDER — PROPOFOL 10 MG/ML IV BOLUS
INTRAVENOUS | Status: DC | PRN
  Administered 2015-08-14: 150 mg via INTRAVENOUS

## 2015-08-14 MED ORDER — LACTATED RINGERS IV SOLN
INTRAVENOUS | Status: DC | PRN
  Administered 2015-08-14 (×2): via INTRAVENOUS

## 2015-08-14 MED ORDER — MIDAZOLAM HCL 5 MG/5ML IJ SOLN
INTRAMUSCULAR | Status: DC | PRN
  Administered 2015-08-14: 2 mg via INTRAVENOUS

## 2015-08-14 MED ORDER — DEXAMETHASONE SODIUM PHOSPHATE 10 MG/ML IJ SOLN
INTRAMUSCULAR | Status: DC | PRN
  Administered 2015-08-14: 10 mg via INTRAVENOUS

## 2015-08-14 MED ORDER — PHENYLEPHRINE HCL 10 MG/ML IJ SOLN
INTRAMUSCULAR | Status: DC | PRN
  Administered 2015-08-14: 20 ug/min via INTRAVENOUS

## 2015-08-14 MED ORDER — PROPOFOL 500 MG/50ML IV EMUL
INTRAVENOUS | Status: DC | PRN
  Administered 2015-08-14: 30 mg via INTRAVENOUS

## 2015-08-14 MED ORDER — ALBUMIN HUMAN 5 % IV SOLN
INTRAVENOUS | Status: DC | PRN
  Administered 2015-08-14 (×2): via INTRAVENOUS

## 2015-08-14 MED ORDER — SODIUM CHLORIDE 0.9 % IV SOLN
50.0000 ug/h | INTRAVENOUS | Status: DC
  Administered 2015-08-14: 50 ug/h via INTRAVENOUS
  Filled 2015-08-14: qty 50

## 2015-08-14 MED ORDER — DEXAMETHASONE SODIUM PHOSPHATE 10 MG/ML IJ SOLN
INTRAMUSCULAR | Status: AC
  Filled 2015-08-14: qty 1

## 2015-08-14 MED ORDER — LACTATED RINGERS IV SOLN
INTRAVENOUS | Status: DC | PRN
  Administered 2015-08-14: 14:00:00 via INTRAVENOUS

## 2015-08-14 MED ORDER — METOCLOPRAMIDE HCL 5 MG/ML IJ SOLN: 10.0000 mg | Freq: Once | INTRAMUSCULAR | Status: DC | PRN

## 2015-08-14 MED ORDER — ROCURONIUM BROMIDE 100 MG/10ML IV SOLN
INTRAVENOUS | Status: AC
  Filled 2015-08-14: qty 1

## 2015-08-14 MED ORDER — FLEET ENEMA 7-19 GM/118ML RE ENEM
2.0000 | ENEMA | Freq: Once | RECTAL | Status: AC
  Administered 2015-08-14: 2 via RECTAL
  Filled 2015-08-14: qty 2

## 2015-08-14 MED ORDER — PHENYLEPHRINE HCL 10 MG/ML IJ SOLN
INTRAMUSCULAR | Status: AC
  Filled 2015-08-14: qty 1

## 2015-08-14 MED ORDER — LIDOCAINE HCL (CARDIAC) 20 MG/ML IV SOLN
INTRAVENOUS | Status: DC | PRN
  Administered 2015-08-14: 100 mg via INTRAVENOUS

## 2015-08-14 MED ORDER — BUPIVACAINE-EPINEPHRINE 0.25% -1:200000 IJ SOLN
INTRAMUSCULAR | Status: AC
  Filled 2015-08-14: qty 1

## 2015-08-14 MED ORDER — 0.9 % SODIUM CHLORIDE (POUR BTL) OPTIME
TOPICAL | Status: DC | PRN
  Administered 2015-08-14: 4000 mL

## 2015-08-14 MED ORDER — FENTANYL CITRATE (PF) 100 MCG/2ML IJ SOLN
INTRAMUSCULAR | Status: AC
  Filled 2015-08-14: qty 2

## 2015-08-14 MED ORDER — MIDAZOLAM HCL 2 MG/2ML IJ SOLN
INTRAMUSCULAR | Status: AC
  Filled 2015-08-14: qty 2

## 2015-08-14 MED ORDER — MORPHINE SULFATE (PF) 2 MG/ML IV SOLN
1.0000 mg | Freq: Once | INTRAVENOUS | Status: AC
  Administered 2015-08-14: 1 mg via INTRAVENOUS
  Filled 2015-08-14: qty 1

## 2015-08-14 MED ORDER — PIPERACILLIN-TAZOBACTAM 3.375 G IVPB
3.3750 g | INTRAVENOUS | Status: DC
  Filled 2015-08-14: qty 50

## 2015-08-14 MED ORDER — POLYETHYLENE GLYCOL 3350 17 GM/SCOOP PO POWD
1.0000 | Freq: Once | ORAL | Status: AC
  Administered 2015-08-14: 255 g via ORAL
  Filled 2015-08-14: qty 255

## 2015-08-14 MED ORDER — MEPERIDINE HCL 25 MG/ML IJ SOLN: 6.2500 mg | INTRAMUSCULAR | Status: DC | PRN

## 2015-08-14 MED ORDER — PIPERACILLIN-TAZOBACTAM 3.375 G IVPB
3.3750 g | Freq: Three times a day (TID) | INTRAVENOUS | Status: DC
  Administered 2015-08-15 – 2015-08-16 (×4): 3.375 g via INTRAVENOUS
  Filled 2015-08-14 (×4): qty 50

## 2015-08-14 SURGICAL SUPPLY — 81 items
APPLICATOR COTTON TIP 6IN STRL (MISCELLANEOUS) IMPLANT
APPLIER CLIP ROT 10 11.4 M/L (STAPLE)
BLADE EXTENDED COATED 6.5IN (ELECTRODE) ×3 IMPLANT
BLADE HEX COATED 2.75 (ELECTRODE) ×3 IMPLANT
CABLE HIGH FREQUENCY MONO STRZ (ELECTRODE) ×3 IMPLANT
CELLS DAT CNTRL 66122 CELL SVR (MISCELLANEOUS) IMPLANT
CHLORAPREP W/TINT 26ML (MISCELLANEOUS) ×3 IMPLANT
CLIP APPLIE ROT 10 11.4 M/L (STAPLE) IMPLANT
COVER MAYO STAND STRL (DRAPES) IMPLANT
COVER SURGICAL LIGHT HANDLE (MISCELLANEOUS) ×3 IMPLANT
DECANTER SPIKE VIAL GLASS SM (MISCELLANEOUS) ×3 IMPLANT
DRAIN CHANNEL 19F RND (DRAIN) IMPLANT
DRAPE LAPAROSCOPIC ABDOMINAL (DRAPES) ×3 IMPLANT
DRAPE UTILITY XL STRL (DRAPES) IMPLANT
DRAPE WARM FLUID 44X44 (DRAPE) ×3 IMPLANT
DRSG OPSITE POSTOP 4X10 (GAUZE/BANDAGES/DRESSINGS) IMPLANT
DRSG OPSITE POSTOP 4X6 (GAUZE/BANDAGES/DRESSINGS) IMPLANT
DRSG OPSITE POSTOP 4X8 (GAUZE/BANDAGES/DRESSINGS) IMPLANT
ELECT PENCIL ROCKER SW 15FT (MISCELLANEOUS) ×3 IMPLANT
ELECT REM PT RETURN 9FT ADLT (ELECTROSURGICAL) ×3
ELECTRODE REM PT RTRN 9FT ADLT (ELECTROSURGICAL) ×1 IMPLANT
GAUZE SPONGE 4X4 12PLY STRL (GAUZE/BANDAGES/DRESSINGS) IMPLANT
GLOVE BIOGEL PI IND STRL 7.0 (GLOVE) ×1 IMPLANT
GLOVE BIOGEL PI IND STRL 7.5 (GLOVE) ×2 IMPLANT
GLOVE BIOGEL PI INDICATOR 7.0 (GLOVE) ×2
GLOVE BIOGEL PI INDICATOR 7.5 (GLOVE) ×4
GLOVE ECLIPSE 7.5 STRL STRAW (GLOVE) ×6 IMPLANT
GOWN STRL REUS W/TWL LRG LVL3 (GOWN DISPOSABLE) ×3 IMPLANT
GOWN STRL REUS W/TWL XL LVL3 (GOWN DISPOSABLE) ×12 IMPLANT
HANDLE SUCTION POOLE (INSTRUMENTS) ×1 IMPLANT
IRRIG SUCT STRYKERFLOW 2 WTIP (MISCELLANEOUS) ×3
IRRIGATION SUCT STRKRFLW 2 WTP (MISCELLANEOUS) ×1 IMPLANT
KIT BASIN OR (CUSTOM PROCEDURE TRAY) ×3 IMPLANT
LEGGING LITHOTOMY PAIR STRL (DRAPES) IMPLANT
LIGASURE IMPACT 36 18CM CVD LR (INSTRUMENTS) ×3 IMPLANT
LIQUID BAND (GAUZE/BANDAGES/DRESSINGS) IMPLANT
NS IRRIG 1000ML POUR BTL (IV SOLUTION) ×9 IMPLANT
PACK COLON (CUSTOM PROCEDURE TRAY) IMPLANT
PACK GENERAL/GYN (CUSTOM PROCEDURE TRAY) ×3 IMPLANT
PAD POSITIONING PINK XL (MISCELLANEOUS) IMPLANT
PORT LAP GEL ALEXIS MED 5-9CM (MISCELLANEOUS) IMPLANT
POSITIONER SURGICAL ARM (MISCELLANEOUS) IMPLANT
RELOAD BLUE CHELON 45 (STAPLE) IMPLANT
RELOAD PROXIMATE 75MM BLUE (ENDOMECHANICALS) ×6 IMPLANT
RTRCTR WOUND ALEXIS 18CM MED (MISCELLANEOUS)
SCISSORS LAP 5X35 DISP (ENDOMECHANICALS) ×3 IMPLANT
SHEARS HARMONIC ACE PLUS 36CM (ENDOMECHANICALS) IMPLANT
SLEEVE ADV FIXATION 5X100MM (TROCAR) IMPLANT
SOLUTION ANTI FOG 6CC (MISCELLANEOUS) IMPLANT
SPONGE ABDOMINAL VAC ABTHERA (MISCELLANEOUS) ×3 IMPLANT
SPONGE LAP 18X18 X RAY DECT (DISPOSABLE) IMPLANT
STAPLER PROXIMATE 75MM BLUE (STAPLE) ×3 IMPLANT
STAPLER VISISTAT 35W (STAPLE) IMPLANT
SUCTION POOLE HANDLE (INSTRUMENTS) ×3
SUT PDS AB 0 CT1 36 (SUTURE) IMPLANT
SUT PDS AB 1 CTX 36 (SUTURE) IMPLANT
SUT PDS AB 1 TP1 96 (SUTURE) IMPLANT
SUT PROLENE 2 0 KS (SUTURE) IMPLANT
SUT PROLENE 3 0 SH 48 (SUTURE) IMPLANT
SUT SILK 2 0 (SUTURE) ×2
SUT SILK 2 0 SH CR/8 (SUTURE) ×6 IMPLANT
SUT SILK 2-0 18XBRD TIE 12 (SUTURE) ×1 IMPLANT
SUT SILK 3 0 (SUTURE) ×2
SUT SILK 3 0 SH CR/8 (SUTURE) ×3 IMPLANT
SUT SILK 3-0 18XBRD TIE 12 (SUTURE) ×1 IMPLANT
SUT VIC AB 2-0 SH 27 (SUTURE) ×4
SUT VIC AB 2-0 SH 27X BRD (SUTURE) ×2 IMPLANT
SUT VICRYL 0 UR6 27IN ABS (SUTURE) ×3 IMPLANT
SYS LAPSCP GELPORT 120MM (MISCELLANEOUS)
SYSTEM LAPSCP GELPORT 120MM (MISCELLANEOUS) IMPLANT
TAPE CLOTH 4X10 WHT NS (GAUZE/BANDAGES/DRESSINGS) IMPLANT
TOWEL OR 17X26 10 PK STRL BLUE (TOWEL DISPOSABLE) ×3 IMPLANT
TOWEL OR NON WOVEN STRL DISP B (DISPOSABLE) ×3 IMPLANT
TRAY FOLEY W/METER SILVER 14FR (SET/KITS/TRAYS/PACK) IMPLANT
TRAY FOLEY W/METER SILVER 16FR (SET/KITS/TRAYS/PACK) ×3 IMPLANT
TROCAR ADV FIXATION 12X100MM (TROCAR) ×3 IMPLANT
TROCAR ADV FIXATION 5X100MM (TROCAR) ×3 IMPLANT
TROCAR BLADELESS OPT 5 100 (ENDOMECHANICALS) ×3 IMPLANT
TROCAR XCEL BLUNT TIP 100MML (ENDOMECHANICALS) ×3 IMPLANT
TUBING INSUF HEATED (TUBING) ×3 IMPLANT
YANKAUER SUCT BULB TIP NO VENT (SUCTIONS) IMPLANT

## 2015-08-14 NOTE — Transfer of Care (Signed)
Immediate Anesthesia Transfer of Care Note  Patient: Walter Shields  Procedure(s) Performed: Procedure(s): COLONOSCOPY (N/A)  Patient Location: PACU  Anesthesia Type:MAC  Level of Consciousness:  sedated, patient cooperative and responds to stimulation  Airway & Oxygen Therapy:Patient Spontanous Breathing and Patient connected to face mask oxgen  Post-op Assessment:  Report given to PACU RN and Post -op Vital signs reviewed and stable  Post vital signs:  Reviewed and stable  Last Vitals:  Vitals:   08/14/15 1200 08/14/15 1335  BP:  122/84  Pulse:  (!) 107  Resp:  18  Temp: 37.2 C 37.1 C    Complications: No apparent anesthesia complications

## 2015-08-14 NOTE — Transfer of Care (Signed)
Immediate Anesthesia Transfer of Care Note  Patient: Walter Shields  Procedure(s) Performed: Procedure(s): EXPLORATORY LAPAROTOMY ANASTOMOSIS OF ILLEUM (N/A) LAPAROSCOPIC ABDOMINAL EXPLORATION REDUCTION OF INTERNAL HERNIA (N/A)  Patient Location: ICU  Anesthesia Type:General  Level of Consciousness: Patient remains intubated per anesthesia plan  Airway & Oxygen Therapy: Patient remains intubated per anesthesia plan  Post-op Assessment: Report given to RN and Post -op Vital signs reviewed and stable  Post vital signs: Reviewed and stable  Last Vitals:  Vitals:   08/14/15 1455 08/14/15 1610  BP:    Pulse: (!) 107   Resp: (!) 37   Temp:  36.7 C    Last Pain:  Vitals:   08/14/15 1723  TempSrc:   PainSc: 4       Patients Stated Pain Goal: 4 (08/14/15 1653)  Complications: No apparent anesthesia complications

## 2015-08-14 NOTE — Progress Notes (Signed)
Patient ID: Walter Shields, male   DOB: [birth year], 45 y.o.   MRN: [id number]    PROGRESS NOTE    Walter Shields  WUJ:[id number] DOB: [birth year] DOA: 08/10/2015  PCP: No primary care provider on file.   Brief Narrative:  45 y.o. male with known gastric bypass in 2012 and bleeding peptic ulcer approximately 2 years ago that required transfusion, now presented to the ED following a syncopal episode after his third bloody bowel movement of the day. Patient is from out of town, here for business and was in his usual state of health.   Assessment & Plan:   1. Acute blood loss anemia/ acute GI bleed with symptomatic anemia  - Initial Hgb 7.3 with MCV 73.5; platelets and INR WNL - EGD 08/11/2015 did not show any blood in lumen of upper tract but ? visible vessel noted at surgical anastomosis, was ablated with APC. - No ulcer was appreciated - has received total 7 U PRBC since admission  - Bleeding scan negative - Appreciate GI team recommendations and assistance - No active bleeding overnight  2. Syncope  - pt reports feeling better but has not gotten out of the bed yet - Holding off on PT evaluation until bleeding issues resolved  3. Hx of bleeding PUD  - Pt denies use of NSAIDs or alcohol  - Currently on IV PPI   4. Hx of gastric bypass  - Performed in 2012  - Outside records requested   5. Acute kidney injury - from GI bleed - Creatinine now within normal limits  DVT prophylaxis: SCD's Code Status: Full Family Communication: Patient at bedside  Disposition Plan: home once cleared by GI  Consultants:   GI  Procedures:   EGD 8/1  Antimicrobials:   None    Subjective: No bloody BM's this AM.   Objective: Vitals:   08/14/15 0000 08/14/15 0400 08/14/15 0500 08/14/15 0800  BP: (!) 154/90 (!) 141/89    Pulse: 80 96    Resp: 12 14    Temp: 98.6 F (37 C) 98.4 F (36.9 C)  98.3 F (36.8 C)  TempSrc: Oral Oral  Oral  SpO2:      Weight:   76.4 kg (168 lb 6.9  oz)   Height:        Intake/Output Summary (Last 24 hours) at 08/14/15 1145 Last data filed at 08/14/15 0830  Gross per 24 hour  Intake            467.8 ml  Output             4450 ml  Net          -3982.2 ml   Filed Weights   08/12/15 0500 08/13/15 0400 08/14/15 0500  Weight: 78.7 kg (173 lb 8 oz) 76.7 kg (169 lb 1.5 oz) 76.4 kg (168 lb 6.9 oz)    Examination:  General exam: Appears calm and comfortable  Respiratory system: Clear to auscultation. Respiratory effort normal. Cardiovascular system: S1 & S2 heard, RRR. No JVD, murmurs, rubs, gallops or clicks. No pedal edema. Gastrointestinal system: Abdomen is nondistended, soft and nontender. No organomegaly or masses felt.  Central nervous system: Alert and oriented. No focal neurological deficits. Extremities: Symmetric 5 x 5 power. Psychiatry: Judgement and insight appear normal. Mood & affect appropriate.    Data Reviewed: I have personally reviewed following labs and imaging studies  CBC:  Recent Labs Lab 08/10/15 2218  08/12/15 0324 08/12/15 1709 08/13/15 0353 08/13/15 1634 08/13/15 1804 08/14/15  0333  WBC 10.1  --  9.0  --  6.7  --   --  10.2  HGB 7.3*  < > 6.7* 7.3* 6.1* 8.5* 7.9* 8.1*  HCT 23.7*  < > 20.7* 21.8* 18.6* 25.0* 23.7* 24.5*  MCV 75.5*  --  79.6  --  81.6  --   --  82.5  PLT 334  --  236  --  212  --   --  263  < > = values in this interval not displayed. Basic Metabolic Panel:  Recent Labs Lab 08/10/15 2218 08/11/15 0743 08/12/15 0324 08/13/15 0353 08/13/15 1634 08/14/15 0333  NA 138 140 140 138 142 138  K 4.7 4.4 4.1 3.6 4.2 3.8  CL 108 111 113* 112*  --  107  CO2 24 24 23 23   --  23  GLUCOSE 125* 102* 102* 85 93 79  BUN 36* 36* 38* 36*  --  21*  CREATININE 1.17 1.14 1.35* 0.94  --  0.94  CALCIUM 8.5* 8.3* 8.1* 7.9*  --  8.1*   Liver Function Tests:  Recent Labs Lab 08/11/15 0743  AST 16  ALT 13*  ALKPHOS 38  BILITOT 1.1  PROT 5.0*  ALBUMIN 3.2*   Coagulation  Profile:  Recent Labs Lab 08/10/15 2138  INR 1.16   CBG:  Recent Labs Lab 08/10/15 2052 08/11/15 0753 08/12/15 0809 08/13/15 0932 08/14/15 0810  GLUCAP 97 98 88 119* 81   Anemia Panel: No results for input(s): VITAMINB12, FOLATE, FERRITIN, TIBC, IRON, RETICCTPCT in the last 72 hours. Urine analysis:    Component Value Date/Time   COLORURINE YELLOW 08/10/2015 2038   APPEARANCEUR CLEAR 08/10/2015 2038   LABSPEC 1.025 08/10/2015 2038   PHURINE 6.5 08/10/2015 2038   GLUCOSEU NEGATIVE 08/10/2015 2038   HGBUR NEGATIVE 08/10/2015 2038   BILIRUBINUR SMALL (A) 08/10/2015 2038   KETONESUR NEGATIVE 08/10/2015 2038   PROTEINUR NEGATIVE 08/10/2015 2038   NITRITE NEGATIVE 08/10/2015 2038   LEUKOCYTESUR TRACE (A) 08/10/2015 2038   Recent Results (from the past 240 hour(s))  MRSA PCR Screening     Status: None   Collection Time: 08/11/15  1:31 AM  Result Value Ref Range Status   MRSA by PCR NEGATIVE NEGATIVE Final     Radiology Studies: Nm Gi Blood Loss  Result Date: 08/12/2015 CLINICAL DATA:  Bloody stools since 08/10/2015, anemia, hemoglobin = 6.7 g/dL EXAM: NUCLEAR MEDICINE GASTROINTESTINAL BLEEDING SCAN TECHNIQUE: Sequential abdominal images were obtained following intravenous administration of Tc-68m labeled red blood cells. RADIOPHARMACEUTICALS:  24.1 mCi Tc-42m in-vitro labeled autologous red cells IV. COMPARISON:  None FINDINGS: Imaging performed for 2 hours. Significant patient motion artifacts throughout exam. Normal blood pool distribution of labeled erythrocytes. Small amount of excreted de labeled technetium into urinary bladder. No abnormal gastrointestinal localization of labeled erythrocytes identified to suggest active GI bleeding. IMPRESSION: Negative GI bleeding study. Electronically Signed   By: Ulyses Southward M.D.   On: 08/12/2015 16:45    Scheduled Meds: . cyanocobalamin  500 mcg Oral Daily  . multivitamin with minerals  1 tablet Oral Daily  . omega-3 acid ethyl  esters  1 g Oral Daily  . pantoprazole  40 mg Intravenous Q12H  . sodium chloride flush  3 mL Intravenous Q12H   Continuous Infusions:     LOS: 3 days    Time spent: 20 minutes   Debbora Presto, MD [facility] Pager [phone]  If 7PM-7AM, please contact night-coverage [email or web address] Password [email or web address] 08/14/2015, 11:45 AM

## 2015-08-14 NOTE — Consult Note (Signed)
Reason for Consult:Internal hernia and small bowel obstruction, GI bleed  Referring Physician: [name] is an 45 y.o. male.   HPI: I was asked by Dr. Doyle Askew this evening to evaluate this patient. He is a 45 year old male with a history of laparoscopic Roux-en-Y gastric bypass at [facility] in 2012. He has lost 300 pounds since surgery. He has a history of marginal ulcer about 2 years ago treated nonoperatively. Patient was admitted on 08/11/2015 after he developed a GI bleed with melena and syncope.  Was admitted to the hospitalist service and GI consult. The patient states he was not having abdominal pain at that time but just passing maroon blood. He  Since admission has required 7 units blood transfusion although has remained hemodynamically stable. He has had 2 upper GI endoscopies without  Any definite source of bleedingand colonoscopy showing clot and blood without  Source identified. The patient states that about 2:00 this morning he began to develop pain in his right mid abdomen. After negative endoscopies as above and negative tagged red cell scan GI suggested a CT angiogram of the abdomen to evaluate the bowel and looked for bleeding source. This was just completed and I have personally reviewed the scan. This shows evidence of an internal hernia with markedly dilated Roux limb and possible ischemia as well as obvious twisting of the mesentery.  Past Medical History:  Diagnosis Date  . Gastric ulcer     Past Surgical History:  Procedure Laterality Date  . COLONOSCOPY N/A 08/13/2015   Procedure: COLONOSCOPY;  Surgeon: Manus Gunning, MD;  Location: [facility];  Service: Gastroenterology;  Laterality: N/A;  . ESOPHAGOGASTRODUODENOSCOPY N/A 08/11/2015   Procedure: ESOPHAGOGASTRODUODENOSCOPY (EGD);  Surgeon: Manus Gunning, MD;  Location: [facility];  Service: Gastroenterology;  Laterality: N/A;  . ESOPHAGOGASTRODUODENOSCOPY (EGD) WITH PROPOFOL N/A  08/13/2015   Procedure: ESOPHAGOGASTRODUODENOSCOPY (EGD) WITH PROPOFOL;  Surgeon: Manus Gunning, MD;  Location: [facility];  Service: Gastroenterology;  Laterality: N/A;  . GASTRIC BYPASS  10/21/2010    Family History  Problem Relation Age of Onset  . Hypertension Mother   . Heart failure Mother   . Diabetes Mother   . Heart failure Father   . Hyperlipidemia Father   . Hypertension Father     Social History:  reports that he quit smoking about 6 years ago. His smoking use included Cigarettes. He has never used smokeless tobacco. He reports that he does not drink alcohol or use drugs.  Allergies: No Known Allergies   Current Facility-Administered Medications:  .  acetaminophen (TYLENOL) tablet 650 mg, 650 mg, Oral, Q6H PRN, 650 mg at 08/14/15 0426 **OR** acetaminophen (TYLENOL) suppository 650 mg, 650 mg, Rectal, Q6H PRN, Ilene Qua Opyd, MD .  cyanocobalamin tablet 500 mcg, 500 mcg, Oral, Daily, Vianne Bulls, MD, 500 mcg at 08/14/15 0915 .  HYDROmorphone (DILAUDID) injection 1 mg, 1 mg, Intravenous, Q4H PRN, Theodis Blaze, MD, 1 mg at 08/14/15 1653 .  multivitamin with minerals tablet 1 tablet, 1 tablet, Oral, Daily, Vianne Bulls, MD, 1 tablet at 08/14/15 0915 .  omega-3 acid ethyl esters (LOVAZA) capsule 1 g, 1 g, Oral, Daily, Ilene Qua Opyd, MD, 1 g at 08/14/15 0915 .  pantoprazole (PROTONIX) injection 40 mg, 40 mg, Intravenous, Q12H, Ilene Qua Opyd, MD, 40 mg at 08/14/15 1253 .  piperacillin-tazobactam (ZOSYN) IVPB 3.375 g, 3.375 g, Intravenous, NOW, Excell Seltzer, MD .  sodium chloride flush (NS) 0.9 % injection 3 mL, 3  mL, Intravenous, Q12H, Ilene Qua Opyd, MD, 3 mL at 08/14/15 1000  Results for orders placed or performed during the hospital encounter of 08/10/15 (from the past 48 hour(s))  Basic metabolic panel     Status: Abnormal   Collection Time: 08/13/15  3:53 AM  Result Value Ref Range   Sodium 138 135 - 145 mmol/L   Potassium 3.6 3.5 - 5.1 mmol/L    Chloride 112 (H) 101 - 111 mmol/L   CO2 23 22 - 32 mmol/L   Glucose, Bld 85 65 - 99 mg/dL   BUN 36 (H) 6 - 20 mg/dL   Creatinine, Ser 0.94 0.61 - 1.24 mg/dL   Calcium 7.9 (L) 8.9 - 10.3 mg/dL   GFR calc non Af Amer >60 >60 mL/min   GFR calc Af Amer >60 >60 mL/min    Comment: (NOTE) The eGFR has been calculated using the CKD EPI equation. This calculation has not been validated in all clinical situations. eGFR's persistently <60 mL/min signify possible Chronic Kidney Disease.    Anion gap 3 (L) 5 - 15  CBC     Status: Abnormal   Collection Time: 08/13/15  3:53 AM  Result Value Ref Range   WBC 6.7 4.0 - 10.5 K/uL   RBC 2.28 (L) 4.22 - 5.81 MIL/uL   Hemoglobin 6.1 (LL) 13.0 - 17.0 g/dL    Comment: CRITICAL RESULT CALLED TO, READ BACK BY AND VERIFIED WITH: N.PALNIVAL RN [id number] A.QUIZON    HCT 18.6 (L) 39.0 - 52.0 %   MCV 81.6 78.0 - 100.0 fL   MCH 26.8 26.0 - 34.0 pg   MCHC 32.8 30.0 - 36.0 g/dL   RDW 17.9 (H) 11.5 - 15.5 %   Platelets 212 150 - 400 K/uL  Prepare RBC     Status: None   Collection Time: 08/13/15  5:00 AM  Result Value Ref Range   Order Confirmation ORDER PROCESSED BY BLOOD BANK   Glucose, capillary     Status: Abnormal   Collection Time: 08/13/15  9:32 AM  Result Value Ref Range   Glucose-Capillary 119 (H) 65 - 99 mg/dL   Comment 1 Notify RN    Comment 2 Document in Chart   I-STAT 4, (NA,K, GLUC, HGB,HCT)     Status: Abnormal   Collection Time: 08/13/15  4:34 PM  Result Value Ref Range   Sodium 142 135 - 145 mmol/L   Potassium 4.2 3.5 - 5.1 mmol/L   Glucose, Bld 93 65 - 99 mg/dL   HCT 25.0 (L) 39.0 - 52.0 %   Hemoglobin 8.5 (L) 13.0 - 17.0 g/dL  Hemoglobin and hematocrit, blood     Status: Abnormal   Collection Time: 08/13/15  6:04 PM  Result Value Ref Range   Hemoglobin 7.9 (L) 13.0 - 17.0 g/dL    Comment: DELTA CHECK NOTED POST TRANSFUSION SPECIMEN    HCT 23.7 (L) 39.0 - 52.0 %  CBC     Status: Abnormal   Collection Time: 08/14/15  3:33 AM   Result Value Ref Range   WBC 10.2 4.0 - 10.5 K/uL   RBC 2.97 (L) 4.22 - 5.81 MIL/uL   Hemoglobin 8.1 (L) 13.0 - 17.0 g/dL   HCT 24.5 (L) 39.0 - 52.0 %   MCV 82.5 78.0 - 100.0 fL   MCH 27.3 26.0 - 34.0 pg   MCHC 33.1 30.0 - 36.0 g/dL   RDW 17.8 (H) 11.5 - 15.5 %   Platelets 263 150 - 400 K/uL  Basic metabolic panel     Status: Abnormal   Collection Time: 08/14/15  3:33 AM  Result Value Ref Range   Sodium 138 135 - 145 mmol/L   Potassium 3.8 3.5 - 5.1 mmol/L   Chloride 107 101 - 111 mmol/L   CO2 23 22 - 32 mmol/L   Glucose, Bld 79 65 - 99 mg/dL   BUN 21 (H) 6 - 20 mg/dL   Creatinine, Ser 0.94 0.61 - 1.24 mg/dL   Calcium 8.1 (L) 8.9 - 10.3 mg/dL   GFR calc non Af Amer >60 >60 mL/min   GFR calc Af Amer >60 >60 mL/min    Comment: (NOTE) The eGFR has been calculated using the CKD EPI equation. This calculation has not been validated in all clinical situations. eGFR's persistently <60 mL/min signify possible Chronic Kidney Disease.    Anion gap 8 5 - 15  Glucose, capillary     Status: None   Collection Time: 08/14/15  8:10 AM  Result Value Ref Range   Glucose-Capillary 81 65 - 99 mg/dL  Hemoglobin and hematocrit, blood     Status: Abnormal   Collection Time: 08/14/15  3:47 PM  Result Value Ref Range   Hemoglobin 8.5 (L) 13.0 - 17.0 g/dL   HCT 25.9 (L) 39.0 - 52.0 %    Ct Angio Abdomen Pelvis  W &/or Wo Contrast  Result Date: 08/14/2015 CLINICAL DATA:  Emesis.  GI bleeding. EXAM: CTA ABDOMEN AND PELVIS wITHOUT AND WITH CONTRAST TECHNIQUE: Multidetector CT imaging of the abdomen and pelvis was performed using the standard protocol during bolus administration of intravenous contrast. Multiplanar reconstructed images and MIPs were obtained and reviewed to evaluate the vascular anatomy. CONTRAST:  100 mL of Isovue 370 COMPARISON:  None. FINDINGS: Infiltrate is seen in the lingula, left lower lobe, and medial right lower lobe. There is a small right-sided pleural effusion. The  esophagus is dilated with fluid. No other acute abnormalities are identified in the lower chest. No free air. There is free fluid in the abdomen. The patient is status post gastric surgery. There is dilatation of the distal esophagus and gastric remnants. The efferent limb is dilated from the gastric remnant to the anastomosis with afferent limb. There is a twisting of vessels and bowel at the region of the transition point as seen on series 4, image 73. The small bowel is completely decompressed beyond this transition point. A second twisting of mesenteric vessels is seen more proximally as seen on coronal images 70 through 76. There is also a loop of small bowel which extends into the right side of the pelvis which is thick walled extending from axial image 71 into the pelvis such as on axial image 114. There is no pneumatosis in this location. The colon is relatively decompressed and contains fluid consistent with diarrhea. The appendix is not visualized but there is no secondary evidence of appendicitis. The gallbladder is distended. The liver, spleen, adrenal glands, and kidneys are normal. The pancreas is normal as well. The portal vein is normal. The abdominal aorta is non aneurysmal with no dissection. The proximal celiac, superior mesenteric, inferior mesenteric, and renal arteries are normal. There are 2 renal arteries on the right and 1 on the left. No adenopathy or mass in the pelvis. The bladder is normal. Prostate calcifications are noted. Degenerative changes are seen in the spine. Review of the MIP images confirms the above findings. IMPRESSION: 1. Pneumonia in the lingula and lung bases. 2. There is a  small bowel obstruction with associated swirling/twisting of the mesenteric vessels in two locations, dilated loops of bowel, and an abnormal thick walled loop of bowel extending into the pelvis. The findings are highly concerning for an internal hernia resulting in high-grade obstruction and the  thickened loop of small bowel is very worrisome for an ischemic loop of small bowel. I discussed the findings with the patient's physician, Dr. Doyle Askew. Electronically Signed   By: Dorise Bullion III M.D   On: 08/14/2015 17:46    Review of Systems  Constitutional: Positive for malaise/fatigue. Negative for chills and fever.  Respiratory: Negative.   Cardiovascular: Negative.   Gastrointestinal: Positive for abdominal pain, blood in stool, diarrhea and melena.  Psychiatric/Behavioral: Substance abuse: .cmed.   Blood pressure (!) 151/94, pulse (!) 107, temperature 98 F (36.7 C), temperature source Oral, resp. rate (!) 37, height 5' 7"  (1.702 m), weight 76.2 kg (168 lb), SpO2 100 %. Physical Exam General: Alert, well-developed Caucasian male, in Mild distress Skin: Warm and dry without rash or infection. HEENT: No palpable masses or thyromegaly. Sclera nonicteric. Pupils equal round and reactive.  Lymph nodes: No cervical, supraclavicular, or inguinal nodes palpable. Lungs: Breath sounds clear and equal without increased work of breathing Cardiovascular: Regular tachycardia. No JVD or edema. Peripheral pulses intact. Abdomen: Nondistended. Large excess skin folds. There is moderate tenderness in the mid abdomen and right mid abdomen with slight guarding. Possibly some fullness. Extremities: No edema or joint swelling or deformity. No chronic venous stasis changes. Neurologic: Alert and fully oriented. Affect normal no gross motor deficits.   Assessment/Plan: Status post gastric bypass 2012 with massive weight loss. Presents with GI bleeding  And after workup suspected small bowel or proximal colon source. He now has developed abdominal pain and CT scan shows clear evidence of bowel obstruction and very likely internal hernia. Possible ischemia. The patient requires emergency exploration. We will plan laparoscopy and possible laparotomy for small bowel obstruction and internal hernia. I suspect  as is the source of his GI bleeding as well, possibly secondary to ischemia. I discussed the situation with the patient and the need for emergency surgery. We discussed that this is potentially life-threatening and may require bowel resection. Risks of bleeding and infection and anesthetic complications and bowel injury were discussed. I discussed situation with his family by phone. He agrees to proceed with surgery.  Hermes Wafer T 08/14/2015, 6:53 PM

## 2015-08-14 NOTE — Consult Note (Signed)
PULMONARY / CRITICAL CARE MEDICINE   Name: Walter Shields MRN: [id number] DOB: May 14, 1970    ADMISSION DATE:  08/10/2015 CONSULTATION DATE:  08/14/15  REFERRING MD:  Izola Price, I  CHIEF COMPLAINT:  GI bleeding; incarcerated internal hernia; ischemic bowel  HISTORY OF PRESENT ILLNESS:   Walter Shields is a 89M initially admitted 08/10/15 with complaints of melena and symptomatic anemia. He underwent EGD and colonoscopy, but no source of bleeding was identified. A CTA was obtained which revealed an internal hernia with incarcerated small bowel and likely bowel ischemia. He was taken emergently to the OR for exploratory laparotomy. A fully necrotic portion of small bowel was resected and the abdomen was left open for a second look in 36h to see if another portion of impaired bowel will recover or need to be resected. 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 Since admission he has received 6 units pRBCs.  On arrival to the ICU he is on the ventilator, tachycardic, and hypertensive. Paralytic is still on board. No family is available.   PAST MEDICAL HISTORY :  He  has a past medical history of Gastric ulcer.  PAST SURGICAL HISTORY: He  has a past surgical history that includes Gastric bypass (10/21/2010); Esophagogastroduodenoscopy (N/A, 08/11/2015); Esophagogastroduodenoscopy (egd) with propofol (N/A, 08/13/2015); and Colonoscopy (N/A, 08/13/2015).  No Known Allergies  No current facility-administered medications on file prior to encounter.    No current outpatient prescriptions on file prior to encounter.    FAMILY HISTORY:  His indicated that his mother is deceased. He indicated that his father is deceased.    SOCIAL HISTORY: He  reports that he quit smoking about 6 years ago. His smoking use included Cigarettes. He has never used smokeless tobacco. He reports that he does not drink alcohol or use drugs.  REVIEW OF SYSTEMS:   Unable to obtain 2/2 intubated /  sedated state  SUBJECTIVE:    VITAL SIGNS: BP (!) 151/94   Pulse (!) 107   Temp 98 F (36.7 C) (Oral)   Resp (!) 37   Ht 5\' 7"  (1.702 m)   Wt 76.2 kg (168 lb)   SpO2 100%   BMI 26.31 kg/m   HEMODYNAMICS:    VENTILATOR SETTINGS:    INTAKE / OUTPUT: I/O last 3 completed shifts: In: 1642.8 [I.V.:967.8; Blood:675] Out: 4700 [Urine:700; Emesis/NG output:4000]  PHYSICAL EXAMINATION:  General Well nourished, well developed, thin, intubated, paralyzed  HEENT Sutured lacerations on forehead and chin. ETT and NGT in place  Pulmonary Clear to auscultation bilaterally with no wheezes, rales or ronchi. Good effort, symmetrical expansion.   Cardiovascular Tachycardic, regular rhythm. S1, s2. No m/r/g. Distal pulses palpable.  Abdomen Soft, non-distended, absent bowel sounds. Midline incision with wound Vac in place. No drainage or erythema.   Musculoskeletal Grossly normal  Lymphatics No cervical, supraclavicular adenopathy.   Neurologic Exam limited by paralytic.   Skin/Integuement No rash, no cyanosis, no clubbing. Tattoos.     LABS:  BMET  Recent Labs Lab 08/12/15 0324 08/13/15 0353 08/13/15 1634 08/14/15 0333  NA 140 138 142 138  K 4.1 3.6 4.2 3.8  CL 113* 112*  --  107  CO2 23 23  --  23  BUN 38* 36*  --  21*  CREATININE 1.35* 0.94  --  0.94  GLUCOSE 102* 85 93 79    Electrolytes  Recent Labs Lab 08/12/15 0324 08/13/15 0353 08/14/15 0333  CALCIUM 8.1* 7.9* 8.1*    CBC  Recent Labs Lab 08/12/15 0324  08/13/15 0353  08/13/15 1804 08/14/15 0333 08/14/15 1547  WBC 9.0  --  6.7  --   --  10.2  --   HGB 6.7*  < > 6.1*  < > 7.9* 8.1* 8.5*  HCT 20.7*  < > 18.6*  < > 23.7* 24.5* 25.9*  PLT 236  --  212  --   --  263  --   < > = values in this interval not displayed.  Coag's  Recent Labs Lab 08/10/15 2138  INR 1.16    Sepsis Markers No results for input(s): LATICACIDVEN, PROCALCITON, O2SATVEN in the last 168 hours.  ABG No results for  input(s): PHART, PCO2ART, PO2ART in the last 168 hours.  Liver Enzymes  Recent Labs Lab 08/11/15 0743  AST 16  ALT 13*  ALKPHOS 38  BILITOT 1.1  ALBUMIN 3.2*    Cardiac Enzymes No results for input(s): TROPONINI, PROBNP in the last 168 hours.  Glucose  Recent Labs Lab 08/10/15 2052 08/11/15 0753 08/12/15 0809 08/13/15 0932 08/14/15 0810  GLUCAP 97 98 88 119* 81    Imaging Ct Angio Abdomen Pelvis  W &/or Wo Contrast  Result Date: 08/14/2015 CLINICAL DATA:  Emesis.  GI bleeding. EXAM: CTA ABDOMEN AND PELVIS wITHOUT AND WITH CONTRAST TECHNIQUE: Multidetector CT imaging of the abdomen and pelvis was performed using the standard protocol during bolus administration of intravenous contrast. Multiplanar reconstructed images and MIPs were obtained and reviewed to evaluate the vascular anatomy. CONTRAST:  100 mL of Isovue 370 COMPARISON:  None. FINDINGS: Infiltrate is seen in the lingula, left lower lobe, and medial right lower lobe. There is a small right-sided pleural effusion. The esophagus is dilated with fluid. No other acute abnormalities are identified in the lower chest. No free air. There is free fluid in the abdomen. The patient is status post gastric surgery. There is dilatation of the distal esophagus and gastric remnants. The efferent limb is dilated from the gastric remnant to the anastomosis with afferent limb. There is a twisting of vessels and bowel at the region of the transition point as seen on series 4, image 73. The small bowel is completely decompressed beyond this transition point. A second twisting of mesenteric vessels is seen more proximally as seen on coronal images 70 through 76. There is also a loop of small bowel which extends into the right side of the pelvis which is thick walled extending from axial image 71 into the pelvis such as on axial image 114. There is no pneumatosis in this location. The colon is relatively decompressed and contains fluid consistent  with diarrhea. The appendix is not visualized but there is no secondary evidence of appendicitis. The gallbladder is distended. The liver, spleen, adrenal glands, and kidneys are normal. The pancreas is normal as well. The portal vein is normal. The abdominal aorta is non aneurysmal with no dissection. The proximal celiac, superior mesenteric, inferior mesenteric, and renal arteries are normal. There are 2 renal arteries on the right and 1 on the left. No adenopathy or mass in the pelvis. The bladder is normal. Prostate calcifications are noted. Degenerative changes are seen in the spine. Review of the MIP images confirms the above findings. IMPRESSION: 1. Pneumonia in the lingula and lung bases. 2. There is a small bowel obstruction with associated swirling/twisting of the mesenteric vessels in two locations, dilated loops of bowel, and an abnormal thick walled loop of bowel extending into the pelvis. The findings are  highly concerning for an internal hernia resulting in high-grade obstruction and the thickened loop of small bowel is very worrisome for an ischemic loop of small bowel. I discussed the findings with the patient's physician, Dr. Izola Price. Electronically Signed   By: Gerome Sam III M.D   On: 08/14/2015 17:46     STUDIES:  EGD 8/1: red spot at anastomosis treated with APC EGD 8/3: normal esophagus, normal gastric pouch, no active bleeding or stigmata of bleeding.  Colonoscopy 8/3: poor prep; no active bleeding; procedure aborted  CULTURES: none  ANTIBIOTICS: Zosyn 8/4 >>  SIGNIFICANT EVENTS: See OP note  LINES/TUBES: Foley 8/4 >> ETT 8/4 >> NGT   DISCUSSION: Mr. 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 He is currently in the ICU with open abdomen (wound Vac) with plans for going back to the OR in 36  hours to re-evaluate the bowel.   ASSESSMENT / PLAN:  PULMONARY A: Need for mechanical ventilation P:   Lung protective strategy Defer weaning until after return to OR VAP bundle  CARDIOVASCULAR A:  Tachycardia/hypotension - likely pain related P:  Monitor hemodynamics   RENAL A:   No acute issues Baseline cr ~1.0 P:   Monitor lytes Follow UOP Maintain Foley for now  GASTROINTESTINAL A:   Ischemic bowel s/p resection w/ open abdomen GI bleeding with symptomatic anemia Hx peptic ulcer Hx Roux-en-Y gastric bypass P:   NPO Pain control Continue PPI Continue NGT  HEMATOLOGIC A:   Symptomatic anemia 2/2 GI bleeding - improved s/p 6 u pRBCs P:  Trend CBC Transfuse for Hgb <7 or active bleeding  INFECTIOUS A:   Potential for intraabdominal sepsis P:   Continue Zosyn  ENDOCRINE A:   No acute issues P:   Monitor blood glucose Target euglycemia  NEUROLOGIC A:   No acute issues P:   RASS goal: -3 to -4 Maintain adequate analgesia and sedation while abdomen open   FAMILY  - Updates: no family available.   - Inter-disciplinary family meet or Palliative Care meeting due by:  day 7  The patient is critically ill with multiple organ system failure and requires high complexity decision making for assessment and support, frequent evaluation and titration of therapies, advanced monitoring, review of radiographic studies and interpretation of complex data.   Critical Care Time devoted to patient care services, exclusive of separately billable procedures, described in this note is 45 minutes.   Nita Sickle, MD Pulmonary and Critical Care Medicine [facility] Pager: [phone]  08/14/2015, 11:24 PM

## 2015-08-14 NOTE — Op Note (Signed)
Preoperative Diagnosis: Acute lower GI bleeding [K92.2]  Postoprative Diagnosis: Acute lower GI bleeding [K92.2]  Procedure: Procedure(s): Laparoscopy, laparotomy with reduction of internal hernia, ileocecectomy with anastomosis and placement of abdominal  VAC dressing   Surgeon: Glenna Fellows T   Assistants: Feliciana Rossetti  Anesthesia:  General endotracheal anesthesia  Indications: Patient is a 45 year old male with a history of gastric bypass and [place] in 2012 and massive weight loss of 300 pounds per patient history.  He was admitted to the medicine service 3 days ago with GI bleedingwithout source found and early this morning developed abdominal pain. CT scan was obtained showing a fairly obvious internal hernia with swirling of the mesentery and small bowel obstruction. Immediate exploration with laparoscopy and possible laparotomy was recommended and accepted by the patient. I discussed the indications for the surgery and possible findings and possible need for bowel resection, risks of bleeding or infection or anesthetic complications and he is agreeable.    Procedure Detail:  Patient was taken to the operating room, placed in the supine position on the operating table,  And general endotracheal anesthesia induced. PAS were in place. He received broad-spectrum preoperative IV antibiotics. The abdomen was widely sterilely prepped and draped. Patient timeout was performed and correct procedure verified. Access was obtained with an open Hassan technique in the midline just above the umbilicus and pneumoperitoneum established. Laparoscopy showed dilated loops of small bowel in the upper abdomen with obvious ischemic changes. I elected this point to convert to open laparotomy. The abdomen was explored through an upper midline incision. Examination revealed multiple markedly distended loops of small bowel. The Roux limb was identified  As was the gastric pouch. This entire limb appeared  ischemic but not obviously necrotic. The distal small bowel was identified and the more distal small bowel and colon were seen to be trapped behind the mesenteric defect as well. After thorough exploration and sorting through the anatomy we were able to determine that there was a Peterson defect hernia through which essentially the entire small bowel beginning at the distal Roux limb above thejejunojejunostomy and extending all the way to the proximal ascending colon had herniated beneath  The mesentery at the [place] defect and into the left upper quadrant  With resultant ischemic changes. The bowel was reduced  From left to right through the hernia defect  And normal anatomy was restored. At this point we could clearly traced the Roux limb down to the jejunojejunostomy.  The biliopancreatic limb was traced up to the  Ligament of Treitz and then the common channel was traced down to the ileocecal valve which was now in the proper position in the right lower quadrant and he was clearly normal anatomy had been reestablished. The bowel was left to rest for several minutes and then carefully reexamined. At this point we determined that there was definitely a segment of necrosis of the terminal ileum extending right up to the ileocecal valve and the bowel here was very thick and boggy without much palpable lumen and clearly there was clot  In the mesentery.  I suspect this was the source of his GI bleeding. The entire Roux limb beginning at the gastrojejunostomy and extending down toward the jejunojejunostomy also appeared dusky with possibly some bruising or early necrosis about 15 cm above the jejunojejunostomy.  The jejunojejunostomy itself, the biliopancreatic limb and the vast majority of the common channel down to the necrotic segment of terminal ileum appeared healthy. We elected initially to address the  necrotic area of terminal ileum. The cecum and proximal right colon were mobilized dividing lateral  peritoneal attachments. I then chose a healthy area of ileum and right colon for resection and these were cleaned of mesentery and divided with the GIA 75 mm stapler. The mesentery of the involved segment was divided with the LigaSure and the ileocolic pedicle was also suture ligated and the specimen removed. Following this a functional end-to-end anastomosis was created between the ileum and the right colon with a single firing of the GIA 75 mm stapler. The common enterotomy was closed in layers with running  2-0 Vicryl and seromuscular 2-0 silk. The anastomosis was widely patent appeared to have good blood supply with healthy-appearing bowel on either side. At this point we again carefully examined the bowel working proximally from this anastomosis  And the entire common channel into jejunojejunostomy and biliopancreatic limb appeared healthy. The biliopancreatic limb and gastric remnant were somewhat distended from the previous obstruction but were healthy. The Roux limb appeared to be improving from our original examination. Beginning at the gastrojejunostomy working distally it was dilated and slightly dusky but began to appear more normal. There was a segment about 20 cm above the jejunojejunostomy that appeared slightly more ischemic but possibly bruised and viable. Rather than commit to resecting this entire area and creating a new jejunojejunostomy I elected to bring the patient back for a second look in 36 hours. The abdomen was thoroughly irrigated and hemostasis assured. An abdominal VAC dressing was applied and placed to suction with good closure of the abdominal wound. The patient was transported to the ICU stable. Sponge needle and instrument counts were correct.    Findings: As above  Estimated Blood Loss:  less than 100 mL         Drains: abdominal wound VAC  Blood Given: none          Specimens: Terminal ileum and cecum        Complications:  * No complications entered in OR log *          Disposition: ICU - intubated and hemodynamically stable.         Condition: stable

## 2015-08-14 NOTE — Progress Notes (Signed)
Patient ID: Walter Shields, male   DOB: 1970/06/13, 45 y.o.   MRN: [id number]    PROGRESS NOTE    Walter Shields  WUJ:[id number] DOB: 28-Aug-1970 DOA: 08/10/2015  PCP: No primary care provider on file.   Brief Narrative:  45 y.o. male with known gastric bypass in 2012 and bleeding peptic ulcer approximately 2 years ago that required transfusion, now presented to the ED following a syncopal episode after his third bloody bowel movement of the day. Patient is from out of town, here for business and was in his usual state of health.   Assessment & Plan:   1. Acute blood loss anemia/ acute GI bleed with symptomatic anemia  - Initial Hgb 7.3 with MCV 73.5; platelets and INR WNL - EGD 08/11/2015 did not show any blood in lumen of upper tract but ? visible vessel noted at surgical anastomosis, was ablated with APC. - No ulcer was appreciated - has received total 7 U PRBC since admission  - Bleeding scan negative - Appreciate GI team recommendations and assistance - No active bleeding overnight  2. Right sided abd pain - addendum - called from radiology - ? Ischemic bowel vs strangulated hernia, awaiting for final report  - called surgery on call for asisstance - pt notified of results   2. Syncope  - pt reports feeling better but has not gotten out of the bed yet - Holding off on PT evaluation until bleeding issues resolved  3. Hx of bleeding PUD  - Pt denies use of NSAIDs or alcohol  - Currently on IV PPI   4. Hx of gastric bypass  - Performed in 2012  - Outside records requested   5. Acute kidney injury - from GI bleed - Creatinine now within normal limits  DVT prophylaxis: SCD's Code Status: Full Family Communication: Patient at bedside  Disposition Plan: home once cleared by GI  Consultants:   GI  Procedures:   EGD 8/1  Antimicrobials:   None    Subjective: No bloody BM's this AM.   Objective: Vitals:   08/14/15 1445 08/14/15 1450 08/14/15 1455 08/14/15  1610  BP: (!) 146/96 (!) 151/94    Pulse: (!) 117 (!) 110 (!) 107   Resp: (!) 29 (!) 41 (!) 37   Temp:    98 F (36.7 C)  TempSrc:    Oral  SpO2: 97% 98% 100%   Weight:      Height:        Intake/Output Summary (Last 24 hours) at 08/14/15 1746 Last data filed at 08/14/15 1432  Gross per 24 hour  Intake            667.8 ml  Output             4450 ml  Net          -3782.2 ml   Filed Weights   08/13/15 0400 08/14/15 0500 08/14/15 1335  Weight: 76.7 kg (169 lb 1.5 oz) 76.4 kg (168 lb 6.9 oz) 76.2 kg (168 lb)    Examination:  General exam: Appears calm and comfortable  Respiratory system: Clear to auscultation. Respiratory effort normal. Cardiovascular system: S1 & S2 heard, RRR. No JVD, murmurs, rubs, gallops or clicks. No pedal edema. Gastrointestinal system: Abdomen is nondistended, soft and nontender. No organomegaly or masses felt.  Central nervous system: Alert and oriented. No focal neurological deficits. Extremities: Symmetric 5 x 5 power. Psychiatry: Judgement and insight appear normal. Mood & affect appropriate.    Data  Reviewed: I have personally reviewed following labs and imaging studies  CBC:  Recent Labs Lab 08/10/15 2218  08/12/15 0324  08/13/15 0353 08/13/15 1634 08/13/15 1804 08/14/15 0333 08/14/15 1547  WBC 10.1  --  9.0  --  6.7  --   --  10.2  --   HGB 7.3*  < > 6.7*  < > 6.1* 8.5* 7.9* 8.1* 8.5*  HCT 23.7*  < > 20.7*  < > 18.6* 25.0* 23.7* 24.5* 25.9*  MCV 75.5*  --  79.6  --  81.6  --   --  82.5  --   PLT 334  --  236  --  212  --   --  263  --   < > = values in this interval not displayed. Basic Metabolic Panel:  Recent Labs Lab 08/10/15 2218 08/11/15 0743 08/12/15 0324 08/13/15 0353 08/13/15 1634 08/14/15 0333  NA 138 140 140 138 142 138  K 4.7 4.4 4.1 3.6 4.2 3.8  CL 108 111 113* 112*  --  107  CO2 24 24 23 23   --  23  GLUCOSE 125* 102* 102* 85 93 79  BUN 36* 36* 38* 36*  --  21*  CREATININE 1.17 1.14 1.35* 0.94  --  0.94    CALCIUM 8.5* 8.3* 8.1* 7.9*  --  8.1*   Liver Function Tests:  Recent Labs Lab 08/11/15 0743  AST 16  ALT 13*  ALKPHOS 38  BILITOT 1.1  PROT 5.0*  ALBUMIN 3.2*   Coagulation Profile:  Recent Labs Lab 08/10/15 2138  INR 1.16   CBG:  Recent Labs Lab 08/10/15 2052 08/11/15 0753 08/12/15 0809 08/13/15 0932 08/14/15 0810  GLUCAP 97 98 88 119* 81   Anemia Panel: No results for input(s): VITAMINB12, FOLATE, FERRITIN, TIBC, IRON, RETICCTPCT in the last 72 hours. Urine analysis:    Component Value Date/Time   COLORURINE YELLOW 08/10/2015 2038   APPEARANCEUR CLEAR 08/10/2015 2038   LABSPEC 1.025 08/10/2015 2038   PHURINE 6.5 08/10/2015 2038   GLUCOSEU NEGATIVE 08/10/2015 2038   HGBUR NEGATIVE 08/10/2015 2038   BILIRUBINUR SMALL (A) 08/10/2015 2038   KETONESUR NEGATIVE 08/10/2015 2038   PROTEINUR NEGATIVE 08/10/2015 2038   NITRITE NEGATIVE 08/10/2015 2038   LEUKOCYTESUR TRACE (A) 08/10/2015 2038   Recent Results (from the past 240 hour(s))  MRSA PCR Screening     Status: None   Collection Time: 08/11/15  1:31 AM  Result Value Ref Range Status   MRSA by PCR NEGATIVE NEGATIVE Final     Radiology Studies: No results found.  Scheduled Meds: . cyanocobalamin  500 mcg Oral Daily  . multivitamin with minerals  1 tablet Oral Daily  . omega-3 acid ethyl esters  1 g Oral Daily  . pantoprazole  40 mg Intravenous Q12H  . sodium chloride flush  3 mL Intravenous Q12H   Continuous Infusions:     LOS: 3 days    Time spent: 20 minutes   Debbora Presto, MD [facility] Pager [phone]  If 7PM-7AM, please contact night-coverage [email or web address] Password [email or web address] 08/14/2015, 5:46 PM

## 2015-08-14 NOTE — Op Note (Signed)
[facility] Patient Name: Walter Shields Procedure Date: 08/14/2015 MRN: [id number] Attending MD: Willaim Rayas. Walter Shields , MD Date of Birth: [birth year] CSN: [id number] Age: 45 Admit Type: Inpatient Procedure:                Colonoscopy Indications:              Evaluation of unexplained GI bleeding - patient has                            completed 2 bowel preps Providers:                Viviann Spare P. Walter Lank, MD, Dwain Sarna, RN, Oletha Blend, Technician Referring MD:              Medicines:                Monitored Anesthesia Care Complications:            No immediate complications. Estimated blood loss:                            Minimal. Estimated Blood Loss:     Estimated blood loss was minimal. Procedure:                Pre-Anesthesia Assessment:                           - Prior to the procedure, a History and Physical                            was performed, and patient medications and                            allergies were reviewed. The patient's tolerance of                            previous anesthesia was also reviewed. The risks                            and benefits of the procedure and the sedation                            options and risks were discussed with the patient.                            All questions were answered, and informed consent                            was obtained. Prior Anticoagulants: The patient has                            taken no previous anticoagulant or antiplatelet                            agents. ASA  Grade Assessment: III - A patient with                            severe systemic disease. After reviewing the risks                            and benefits, the patient was deemed in                            satisfactory condition to undergo the procedure.                           After obtaining informed consent, the colonoscope                            was passed under  direct vision. Throughout the                            procedure, the patient's blood pressure, pulse, and                            oxygen saturations were monitored continuously. The                            EC-[id number] ([id number]) scope was introduced through                            the anus and advanced to the the cecum, identified                            by the ileocecal valve. The patient tolerated the                            procedure well. The colonoscopy was technically                            difficult and complex due to poor bowel prep. The                            quality of the bowel preparation was poor. The                            rectum was photographed. Scope In: 2:00:11 PM Scope Out: 2:35:02 PM Scope Withdrawal Time: 0 hours 14 minutes 30 seconds  Total Procedure Duration: 0 hours 34 minutes 51 seconds  Findings:      The perianal exam findings include non-thrombosed external hemorrhoids.      Clotted blood mixed with stool was found in the entire colon. The blood       was mostly maroon colored. Visualization was extremely poor despite       lavage as clot continually clogged the endoscopy. I suspect the right       colon / cecum was reached but I could not identify any landmarks due to       prep. Given  extremely poor visualization, I could not identify an       etiology, the exam was inadequate. Impression:               - Preparation of the colon was poor. Blood clots                            noted throughout the entire colon which could not                            be cleared                           - Non-thrombosed external hemorrhoids found on                            perianal exam.                           - Blood in the entire examined colon. Moderate Sedation:      No moderate sedation, case performed with MAC Recommendation:           - Return patient to hospital ward for ongoing care.                           - NPO                            - Continue present medications                           - Recommend CT angiography of the abdomen to                            further evaluate for source of bleeding and provide                            cross sectional imaging of the bowel to assess for                            any bowel abnormality which may help localize the                            source                           - Pending CT angiography may proceed with capsule                            endoscopy                           - If significant bleeding overnight, would have low                            threshold to repeat bleeding scan if CT angio is  negative                           - Serial Hgb, transfuse as needed                           - No recommendation at this time regarding repeat                            colonoscopy. Procedure Code(s):        --- Professional ---                           [id number], Colonoscopy, flexible; diagnostic, including                            collection of specimen(s) by brushing or washing,                            when performed (separate procedure) Diagnosis Code(s):        --- Professional ---                           K64.4, Residual hemorrhoidal skin tags                           K92.2, Gastrointestinal hemorrhage, unspecified CPT copyright 2016 American Medical Association. All rights reserved. The codes documented in this report are preliminary and upon coder review may  be revised to meet current compliance requirements. Viviann Spare P. Omer Monter, MD 08/14/2015 2:50:40 PM This report has been signed electronically. Number of Addenda: 0

## 2015-08-14 NOTE — Progress Notes (Signed)
Pt had been on room air with sats 95-100% all morning. Went to endo for colonoscopy and received propofol and versed. Pt came back to unit and O2 steadily dropped to the upper 80's. Placed on 2L with no change in sats. Bumped up to 5L [place] and sats are remaining in the low 90s. MD made aware and ordered to continue to monitor. Julio Sicks RN

## 2015-08-14 NOTE — Progress Notes (Signed)
Patient not tolerating Moviprep tonight. He states he feels "stopped up". He had one bowel movement and one episode of emesis. This was followed by right abdominal cramping that reached an 8/10. He is unable to find a comfortable position. Pain med was given. Coverage MD notified multiple times for this during the shift.

## 2015-08-14 NOTE — Anesthesia Preprocedure Evaluation (Signed)
Anesthesia Evaluation  Patient identified by MRN, date of birth, ID band Patient awake    Reviewed: Allergy & Precautions, NPO status , Patient's Chart, lab work & pertinent test results  Airway Mallampati: II  TM Distance: >3 FB Neck ROM: Full    Dental no notable dental hx.    Pulmonary neg pulmonary ROS, former smoker,    Pulmonary exam normal        Cardiovascular negative cardio ROS Normal cardiovascular exam Rhythm:Regular Rate:Normal     Neuro/Psych Syncopal episode thought secondary to acute blood loss anemia negative psych ROS   GI/Hepatic Neg liver ROS, PUD, Current suspected upper GI bleed S/p gastric bypass in 2012   Endo/Other  negative endocrine ROS  Renal/GU negative Renal ROS  negative genitourinary   Musculoskeletal   Abdominal Normal abdominal exam  (+)   Peds negative pediatric ROS (+)  Hematology  (+) Blood dyscrasia, anemia ,   Anesthesia Other Findings   Reproductive/Obstetrics                             Anesthesia Physical  Anesthesia Plan  ASA: II  Anesthesia Plan: MAC   Post-op Pain Management:    Induction: Intravenous  Airway Management Planned: Nasal Cannula  Additional Equipment: None  Intra-op Plan:   Post-operative Plan:   Informed Consent: I have reviewed the patients History and Physical, chart, labs and discussed the procedure including the risks, benefits and alternatives for the proposed anesthesia with the patient or authorized representative who has indicated his/her understanding and acceptance.   Dental advisory given  Plan Discussed with: CRNA and Anesthesiologist  Anesthesia Plan Comments:         Anesthesia Quick Evaluation

## 2015-08-14 NOTE — Interval H&P Note (Signed)
History and Physical Interval Note:  08/14/2015 1:42 PM  Walter Shields  has presented today for surgery, with the diagnosis of GI bleed  The various methods of treatment have been discussed with the patient and family. After consideration of risks, benefits and other options for treatment, the patient has consented to  Procedure(s): COLONOSCOPY (N/A) as a surgical intervention .  The patient's history has been reviewed, patient examined, no change in status, stable for surgery.  I have reviewed the patient's chart and labs.  Questions were answered to the patient's satisfaction.     Reeves Forth Jamee Pacholski

## 2015-08-14 NOTE — Anesthesia Postprocedure Evaluation (Signed)
Anesthesia Post Note  Patient: Walter Shields  Procedure(s) Performed: Procedure(s) (LRB): EXPLORATORY LAPAROTOMY ANASTOMOSIS OF ILLEUM (N/A) LAPAROSCOPIC ABDOMINAL EXPLORATION REDUCTION OF INTERNAL HERNIA (N/A)  Patient location during evaluation: ICU Anesthesia Type: General Level of consciousness: sedated and patient remains intubated per anesthesia plan Vital Signs Assessment: post-procedure vital signs reviewed and stable Respiratory status: respiratory function stable and patient on ventilator - see flowsheet for VS Cardiovascular status: blood pressure returned to baseline and stable Anesthetic complications: no Comments: Pt remains intubated per surgeon's request. Abdomen left open. Plan is to come back to OR on Sunday AM for a second look/washout.    Last Vitals:  Vitals:   08/14/15 1455 08/14/15 1610  BP:    Pulse: (!) 107   Resp: (!) 37   Temp:  36.7 C    Last Pain:  Vitals:   08/14/15 1723  TempSrc:   PainSc: 4                  Phillips Grout

## 2015-08-14 NOTE — H&P (View-Only) (Signed)
Patient ID: Walter Shields, male   DOB: [birth year], 45 y.o.   MRN: [id number]    Progress Note   Subjective   Did not complete prep - vomited  During first liter of moviprep and did not drink anymore- started having bad RLQ cramping about 2 am "spasm"  No melena . HGB 8.1 this am -stable   Objective   Vital signs in last 24 hours: Temp:  [97.7 F (36.5 C)-98.8 F (37.1 C)] 98.4 F (36.9 C) (08/04 0400) Pulse Rate:  [72-96] 96 (08/04 0400) Resp:  [12-24] 14 (08/04 0400) BP: (112-194)/(76-155) 141/89 (08/04 0400) SpO2:  [100 %] 100 % (08/03 1740) Weight:  [168 lb 6.9 oz (76.4 kg)] 168 lb 6.9 oz (76.4 kg) (08/04 0500) Last BM Date: 08/13/15 General:    white male in NAD, uncomfortable Heart:  Regular rate and rhythm; no murmurs Lungs: Respirations even and unlabored, lungs CTA bilaterally Abdomen:  Soft, tender lower abdomen , mild  and nondistended. Normal bowel sounds no rebound Extremities:  Without edema. Neurologic:  Alert and oriented,  grossly normal neurologically. Psych:  Cooperative. Normal mood and affect.  Intake/Output from previous day: 08/03 0701 - 08/04 0700 In: 1142.8 [I.V.:467.8; Blood:675] Out: 4550 [Urine:550; Emesis/NG output:4000] Intake/Output this shift: No intake/output data recorded.  Lab Results:  Recent Labs  08/12/15 0324  08/13/15 0353 08/13/15 1634 08/13/15 1804 08/14/15 0333  WBC 9.0  --  6.7  --   --  10.2  HGB 6.7*  < > 6.1* 8.5* 7.9* 8.1*  HCT 20.7*  < > 18.6* 25.0* 23.7* 24.5*  PLT 236  --  212  --   --  263  < > = values in this interval not displayed. BMET  Recent Labs  08/12/15 0324 08/13/15 0353 08/13/15 1634 08/14/15 0333  NA 140 138 142 138  K 4.1 3.6 4.2 3.8  CL 113* 112*  --  107  CO2 23 23  --  23  GLUCOSE 102* 85 93 79  BUN 38* 36*  --  21*  CREATININE 1.35* 0.94  --  0.94  CALCIUM 8.1* 7.9*  --  8.1*   LFT No results for input(s): PROT, ALBUMIN, AST, ALT, ALKPHOS, BILITOT, BILIDIR, IBILI in the last 72  hours. PT/INR No results for input(s): LABPROT, INR in the last 72 hours.  Studies/Results: Nm Gi Blood Loss  Result Date: 08/12/2015 CLINICAL DATA:  Bloody stools since 08/10/2015, anemia, hemoglobin = 6.7 g/dL EXAM: NUCLEAR MEDICINE GASTROINTESTINAL BLEEDING SCAN TECHNIQUE: Sequential abdominal images were obtained following intravenous administration of Tc-38m labeled red blood cells. RADIOPHARMACEUTICALS:  24.1 mCi Tc-62m in-vitro labeled autologous red cells IV. COMPARISON:  None FINDINGS: Imaging performed for 2 hours. Significant patient motion artifacts throughout exam. Normal blood pool distribution of labeled erythrocytes. Small amount of excreted de labeled technetium into urinary bladder. No abnormal gastrointestinal localization of labeled erythrocytes identified to suggest active GI bleeding. IMPRESSION: Negative GI bleeding study. Electronically Signed   By: Ulyses Southward M.D.   On: 08/12/2015 16:45       Assessment / Plan:    #1 45 yo WM with acute GI bleeding of unclear etiology- attempt at Colonoscopy last pm not successful secondary to poor prep  No active bleeding overnight,cramping which is new For repeat prep this am with Miralax- will also give 2 fleets enemas now Moved colonoscopy to 2 pm to allow time for prep. #2 anemia secondary to blood loss- s/p 7 units of blood since admit  If colon negative -  need meckels scan ,capsule  Principal Problem:   Acute GI bleeding Active Problems:   Syncope   Symptomatic anemia   History of bleeding peptic ulcer   Lower GI bleeding     LOS: 3 days   Amy Esterwood  08/14/2015, 8:46 AM

## 2015-08-14 NOTE — Progress Notes (Signed)
Patient ID: Walter Shields, male   DOB: 10/26/1970, 44 y.o.   MRN: [id number]    Progress Note   Subjective   Did not complete prep - vomited  During first liter of moviprep and did not drink anymore- started having bad RLQ cramping about 2 am "spasm"  No melena . HGB 8.1 this am -stable   Objective   Vital signs in last 24 hours: Temp:  [97.7 F (36.5 C)-98.8 F (37.1 C)] 98.4 F (36.9 C) (08/04 0400) Pulse Rate:  [72-96] 96 (08/04 0400) Resp:  [12-24] 14 (08/04 0400) BP: (112-194)/(76-155) 141/89 (08/04 0400) SpO2:  [100 %] 100 % (08/03 1740) Weight:  [168 lb 6.9 oz (76.4 kg)] 168 lb 6.9 oz (76.4 kg) (08/04 0500) Last BM Date: 08/13/15 General:    white male in NAD, uncomfortable Heart:  Regular rate and rhythm; no murmurs Lungs: Respirations even and unlabored, lungs CTA bilaterally Abdomen:  Soft, tender lower abdomen , mild  and nondistended. Normal bowel sounds no rebound Extremities:  Without edema. Neurologic:  Alert and oriented,  grossly normal neurologically. Psych:  Cooperative. Normal mood and affect.  Intake/Output from previous day: 08/03 0701 - 08/04 0700 In: 1142.8 [I.V.:467.8; Blood:675] Out: 4550 [Urine:550; Emesis/NG output:4000] Intake/Output this shift: No intake/output data recorded.  Lab Results:  Recent Labs  08/12/15 0324  08/13/15 0353 08/13/15 1634 08/13/15 1804 08/14/15 0333  WBC 9.0  --  6.7  --   --  10.2  HGB 6.7*  < > 6.1* 8.5* 7.9* 8.1*  HCT 20.7*  < > 18.6* 25.0* 23.7* 24.5*  PLT 236  --  212  --   --  263  < > = values in this interval not displayed. BMET  Recent Labs  08/12/15 0324 08/13/15 0353 08/13/15 1634 08/14/15 0333  NA 140 138 142 138  K 4.1 3.6 4.2 3.8  CL 113* 112*  --  107  CO2 23 23  --  23  GLUCOSE 102* 85 93 79  BUN 38* 36*  --  21*  CREATININE 1.35* 0.94  --  0.94  CALCIUM 8.1* 7.9*  --  8.1*   LFT No results for input(s): PROT, ALBUMIN, AST, ALT, ALKPHOS, BILITOT, BILIDIR, IBILI in the last 72  hours. PT/INR No results for input(s): LABPROT, INR in the last 72 hours.  Studies/Results: Nm Gi Blood Loss  Result Date: 08/12/2015 CLINICAL DATA:  Bloody stools since 08/10/2015, anemia, hemoglobin = 6.7 g/dL EXAM: NUCLEAR MEDICINE GASTROINTESTINAL BLEEDING SCAN TECHNIQUE: Sequential abdominal images were obtained following intravenous administration of Tc-99m labeled red blood cells. RADIOPHARMACEUTICALS:  24.1 mCi Tc-99m in-vitro labeled autologous red cells IV. COMPARISON:  None FINDINGS: Imaging performed for 2 hours. Significant patient motion artifacts throughout exam. Normal blood pool distribution of labeled erythrocytes. Small amount of excreted de labeled technetium into urinary bladder. No abnormal gastrointestinal localization of labeled erythrocytes identified to suggest active GI bleeding. IMPRESSION: Negative GI bleeding study. Electronically Signed   By: Mark  Boles M.D.   On: 08/12/2015 16:45       Assessment / Plan:    #1 44 yo WM with acute GI bleeding of unclear etiology- attempt at Colonoscopy last pm not successful secondary to poor prep  No active bleeding overnight,cramping which is new For repeat prep this am with Miralax- will also give 2 fleets enemas now Moved colonoscopy to 2 pm to allow time for prep. #2 anemia secondary to blood loss- s/p 7 units of blood since admit  If colon negative -   need meckels scan ,capsule  Principal Problem:   Acute GI bleeding Active Problems:   Syncope   Symptomatic anemia   History of bleeding peptic ulcer   Lower GI bleeding     LOS: 3 days   Walter Shields  08/14/2015, 8:46 AM

## 2015-08-14 NOTE — Anesthesia Procedure Notes (Signed)
Procedure Name: Intubation Date/Time: 08/14/2015 8:01 PM Performed by: Doran Clay Pre-anesthesia Checklist: Patient identified, Timeout performed, Emergency Drugs available, Suction available and Patient being monitored Patient Re-evaluated:Patient Re-evaluated prior to inductionOxygen Delivery Method: Circle system utilized Preoxygenation: Pre-oxygenation with 100% oxygen Intubation Type: IV induction, Cricoid Pressure applied and Rapid sequence Laryngoscope Size: Mac and 4 Grade View: Grade I Tube type: Oral Tube size: 7.5 mm Number of attempts: 1 Airway Equipment and Method: Stylet Placement Confirmation: ETT inserted through vocal cords under direct vision,  positive ETCO2 and breath sounds checked- equal and bilateral Secured at: 22 cm Tube secured with: Tape Dental Injury: Teeth and Oropharynx as per pre-operative assessment

## 2015-08-14 NOTE — Anesthesia Preprocedure Evaluation (Signed)
Anesthesia Evaluation  Patient identified by MRN, date of birth, ID band Patient awake    Reviewed: Allergy & Precautions, NPO status , Patient's Chart, lab work & pertinent test results  Airway Mallampati: II  TM Distance: >3 FB Neck ROM: Full    Dental no notable dental hx.    Pulmonary neg pulmonary ROS, former smoker,    Pulmonary exam normal        Cardiovascular negative cardio ROS Normal cardiovascular exam Rhythm:Regular Rate:Normal     Neuro/Psych Syncopal episode thought secondary to acute blood loss anemia negative psych ROS   GI/Hepatic Neg liver ROS, PUD, Current suspected upper GI bleed S/p gastric bypass in 2012   Endo/Other  negative endocrine ROS  Renal/GU negative Renal ROS  negative genitourinary   Musculoskeletal   Abdominal Normal abdominal exam  (+)   Peds negative pediatric ROS (+)  Hematology  (+) Blood dyscrasia, anemia ,   Anesthesia Other Findings   Reproductive/Obstetrics                             Anesthesia Physical  Anesthesia Plan  ASA: II  Anesthesia Plan: General   Post-op Pain Management:    Induction: Intravenous, Rapid sequence and Cricoid pressure planned  Airway Management Planned: Oral ETT  Additional Equipment:   Intra-op Plan:   Post-operative Plan:   Informed Consent: I have reviewed the patients History and Physical, chart, labs and discussed the procedure including the risks, benefits and alternatives for the proposed anesthesia with the patient or authorized representative who has indicated his/her understanding and acceptance.   Dental advisory given  Plan Discussed with: CRNA and Anesthesiologist  Anesthesia Plan Comments:         Anesthesia Quick Evaluation

## 2015-08-15 DIAGNOSIS — D62 Acute posthemorrhagic anemia: Secondary | ICD-10-CM

## 2015-08-15 DIAGNOSIS — N179 Acute kidney failure, unspecified: Secondary | ICD-10-CM

## 2015-08-15 DIAGNOSIS — K922 Gastrointestinal hemorrhage, unspecified: Principal | ICD-10-CM

## 2015-08-15 DIAGNOSIS — R579 Shock, unspecified: Secondary | ICD-10-CM

## 2015-08-15 LAB — BASIC METABOLIC PANEL
Anion gap: 7 (ref 5–15)
BUN: 29 mg/dL — AB (ref 6–20)
CO2: 22 mmol/L (ref 22–32)
CREATININE: 1.31 mg/dL — AB (ref 0.61–1.24)
Calcium: 7 mg/dL — ABNORMAL LOW (ref 8.9–10.3)
Chloride: 109 mmol/L (ref 101–111)
GFR calc Af Amer: >60 mL/min (ref >60)
GFR calc non Af Amer: >60 mL/min (ref >60)
GLUCOSE: 167 mg/dL — AB (ref 65–99)
Potassium: 3.9 mmol/L (ref 3.5–5.1)
Sodium: 138 mmol/L (ref 135–145)

## 2015-08-15 LAB — FIBRINOGEN: FIBRINOGEN: 577 mg/dL — AB (ref 210–475)

## 2015-08-15 LAB — GLUCOSE, CAPILLARY
GLUCOSE-CAPILLARY: 158 mg/dL — AB (ref 65–99)
GLUCOSE-CAPILLARY: 121 mg/dL — AB (ref 65–99)
Glucose-Capillary: 137 mg/dL — ABNORMAL HIGH (ref 65–99)
Glucose-Capillary: 144 mg/dL — ABNORMAL HIGH (ref 65–99)
Glucose-Capillary: 182 mg/dL — ABNORMAL HIGH (ref 65–99)

## 2015-08-15 LAB — APTT: aPTT: 42 seconds — ABNORMAL HIGH (ref 24–36)

## 2015-08-15 LAB — TROPONIN I
Troponin I: <0.03 ng/mL (ref <0.03)
Troponin I: <0.03 ng/mL (ref <0.03)

## 2015-08-15 LAB — CBC
HEMATOCRIT: 22 % — AB (ref 39.0–52.0)
Hemoglobin: 7.3 g/dL — ABNORMAL LOW (ref 13.0–17.0)
MCH: 27.8 pg (ref 26.0–34.0)
MCHC: 33.2 g/dL (ref 30.0–36.0)
MCV: 83.7 fL (ref 78.0–100.0)
Platelets: 288 10*3/uL (ref 150–400)
RBC: 2.63 MIL/uL — ABNORMAL LOW (ref 4.22–5.81)
RDW: 19.8 % — AB (ref 11.5–15.5)
WBC: 8.8 10*3/uL (ref 4.0–10.5)

## 2015-08-15 LAB — TRIGLYCERIDES: Triglycerides: 34 mg/dL (ref <150)

## 2015-08-15 LAB — HEMOGLOBIN AND HEMATOCRIT, BLOOD
HCT: 21 % — ABNORMAL LOW (ref 39.0–52.0)
HEMATOCRIT: 23.5 % — AB (ref 39.0–52.0)
Hemoglobin: 6.9 g/dL — CL (ref 13.0–17.0)
Hemoglobin: 7.7 g/dL — ABNORMAL LOW (ref 13.0–17.0)

## 2015-08-15 LAB — PROCALCITONIN: PROCALCITONIN: 20.97 ng/mL

## 2015-08-15 LAB — PROTIME-INR
INR: 1.64
PROTHROMBIN TIME: 19.6 s — AB (ref 11.4–15.2)

## 2015-08-15 LAB — PREPARE RBC (CROSSMATCH)

## 2015-08-15 MED ORDER — FAMOTIDINE IN NACL 20-0.9 MG/50ML-% IV SOLN: 20.0000 mg | INTRAVENOUS | Status: DC

## 2015-08-15 MED ORDER — SODIUM CHLORIDE 0.9 % IV BOLUS (SEPSIS)
1000.0000 mL | Freq: Once | INTRAVENOUS | Status: AC
  Administered 2015-08-15: 1000 mL via INTRAVENOUS

## 2015-08-15 MED ORDER — INSULIN ASPART 100 UNIT/ML ~~LOC~~ SOLN
0.0000 [IU] | SUBCUTANEOUS | Status: DC
  Administered 2015-08-15: 1 [IU] via SUBCUTANEOUS
  Administered 2015-08-15: 2 [IU] via SUBCUTANEOUS
  Administered 2015-08-15: 1 [IU] via SUBCUTANEOUS

## 2015-08-15 MED ORDER — PHENYLEPHRINE HCL 10 MG/ML IJ SOLN
30.0000 ug/min | INTRAVENOUS | Status: DC
  Administered 2015-08-15: 80 ug/min via INTRAVENOUS
  Administered 2015-08-15: 70 ug/min via INTRAVENOUS
  Administered 2015-08-15: 30 ug/min via INTRAVENOUS
  Administered 2015-08-15: 80 ug/min via INTRAVENOUS
  Administered 2015-08-15: 90 ug/min via INTRAVENOUS
  Administered 2015-08-15: 150 ug/min via INTRAVENOUS
  Administered 2015-08-15: 100 ug/min via INTRAVENOUS
  Administered 2015-08-15: 130 ug/min via INTRAVENOUS
  Administered 2015-08-16: 50 ug/min via INTRAVENOUS
  Filled 2015-08-15 (×10): qty 1

## 2015-08-15 MED ORDER — CHLORHEXIDINE GLUCONATE 0.12 % MT SOLN
OROMUCOSAL | Status: AC
  Administered 2015-08-15: 15 mL via OROMUCOSAL
  Filled 2015-08-15: qty 15

## 2015-08-15 MED ORDER — SODIUM CHLORIDE 0.9 % IV SOLN
Freq: Once | INTRAVENOUS | Status: AC
  Administered 2015-08-15: 12:00:00 via INTRAVENOUS

## 2015-08-15 MED ORDER — MIDAZOLAM HCL 2 MG/2ML IJ SOLN: 1.0000 mg | INTRAMUSCULAR | Status: DC | PRN

## 2015-08-15 MED ORDER — FENTANYL BOLUS VIA INFUSION
25.0000 ug | INTRAVENOUS | Status: DC | PRN
Start: 2015-08-15 — End: 2015-08-16
  Filled 2015-08-15: qty 100

## 2015-08-15 MED ORDER — SODIUM CHLORIDE 0.9% FLUSH
10.0000 mL | Freq: Two times a day (BID) | INTRAVENOUS | Status: DC
  Administered 2015-08-15: 10 mL

## 2015-08-15 MED ORDER — ANTISEPTIC ORAL RINSE SOLUTION (CORINZ)
7.0000 mL | Freq: Four times a day (QID) | OROMUCOSAL | Status: DC
  Administered 2015-08-15 – 2015-08-16 (×5): 7 mL via OROMUCOSAL

## 2015-08-15 MED ORDER — CHLORHEXIDINE GLUCONATE 0.12% ORAL RINSE (MEDLINE KIT)
15.0000 mL | Freq: Two times a day (BID) | OROMUCOSAL | Status: DC
  Administered 2015-08-15 – 2015-08-16 (×4): 15 mL via OROMUCOSAL

## 2015-08-15 MED ORDER — SODIUM CHLORIDE 0.9 % IV SOLN
25.0000 ug/h | INTRAVENOUS | Status: DC
  Administered 2015-08-16: 100 ug/h via INTRAVENOUS
  Filled 2015-08-15: qty 50

## 2015-08-15 MED ORDER — SODIUM CHLORIDE 0.9% FLUSH: 10.0000 mL | INTRAVENOUS | Status: DC | PRN

## 2015-08-15 MED ORDER — SODIUM CHLORIDE 0.9 % IV BOLUS (SEPSIS)
500.0000 mL | Freq: Once | INTRAVENOUS | Status: AC
  Administered 2015-08-15: 500 mL via INTRAVENOUS

## 2015-08-15 NOTE — Progress Notes (Signed)
eLink Physician-Brief Progress Note Patient Name: Graydon Dunmire DOB: 1970-07-15 MRN: [id number]   Date of Service  08/15/2015  HPI/Events of Note  Persistent hypotension despite fluid bolus.  Is on deep sedation due to open abd.  eICU Interventions  Plan: 1 liter NS bolus for BP support NEO gtt for BP support     Intervention Category Intermediate Interventions: Hypotension - evaluation and management  Murel Shenberger 08/15/2015, 3:16 AM

## 2015-08-15 NOTE — Progress Notes (Addendum)
PULMONARY / CRITICAL CARE MEDICINE   Name: Walter Shields MRN: [id number] DOB: [birth year]    ADMISSION DATE:  08/10/2015 CONSULTATION DATE:  08/14/15  REFERRING MD:  Izola Price, I  CHIEF COMPLAINT:  GI bleeding; incarcerated internal hernia; ischemic bowel  HISTORY OF PRESENT ILLNESS:   Walter Shields is a 45M initially admitted 08/10/15 with complaints of melena and symptomatic anemia. He underwent EGD and colonoscopy, but no source of bleeding was identified. A CTA was obtained which revealed an internal hernia with incarcerated small bowel and likely bowel ischemia. He was taken emergently to the OR for exploratory laparotomy. A fully necrotic portion of small bowel was resected and the abdomen was left open for a second look in 36h to see if another portion of impaired bowel will recover or need to be resected. 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 Since admission he has received 6 units pRBCs.  On arrival to the ICU he is on the ventilator, tachycardic, and hypertensive. Paralytic is still on board. No family is available.   SUBJECTIVE: Patient remained hypotensive last night and has been given IV fluid bolus as well as ordered transfusion of packed red blood cells. Started on peripheral Neo-Synephrine overnight. Nods no to any pain or difficulty breathing.  REVIEW OF SYSTEMS:  Unable to obtain secondary to intubation and sedation.  VITAL SIGNS: BP 125/82 (BP Location: Right Arm)   Pulse 77   Temp 98.1 F (36.7 C) (Oral)   Resp 15   Ht 5\' 7"  (1.702 m)   Wt 178 lb 2.1 oz (80.8 kg)   SpO2 100%   BMI 27.90 kg/m   HEMODYNAMICS:    VENTILATOR SETTINGS: Vent Mode: PRVC FiO2 (%):  [30 %-50 %] 30 % Set Rate:  [16 bmp] 16 bmp Vt Set:  [520 mL] 520 mL PEEP:  [5 cmH20] 5 cmH20 Plateau Pressure:  [12 cmH20-14 cmH20] 12 cmH20  INTAKE / OUTPUT: I/O last 3 completed shifts: In: 5850 [I.V.:3300; IV Piggyback:2550] Out: 6505 [Urine:855; Emesis/NG  output:4000; Drains:850; Other:700; Blood:100]  PHYSICAL EXAMINATION: General:  Sedated. No family at bedside. No distress.  Integument:  Warm & dry. No rash on exposed skin. Abdominal incision with wound VAC in place. HEENT:  Moist mucus membranes. No scleral injection or icterus. Endotracheal tube in place.  Cardiovascular:  Regular rate. No edema. No appreciable JVD.  Pulmonary:  Good aeration & clear to auscultation bilaterally. Symmetric chest wall rise on ventilator. Abdomen: Soft. Normal bowel sounds. Nondistended. Neurological: Patient nods to questions. Sedated. Back to sleep easily. Moving all 4 extremities and grossly nonfocal.  LABS:  BMET  Recent Labs Lab 08/13/15 0353 08/13/15 1634 08/14/15 0333 08/15/15 0555  NA 138 142 138 138  K 3.6 4.2 3.8 3.9  CL 112*  --  107 109  CO2 23  --  23 22  BUN 36*  --  21* 29*  CREATININE 0.94  --  0.94 1.31*  GLUCOSE 85 93 79 167*    Electrolytes  Recent Labs Lab 08/13/15 0353 08/14/15 0333 08/15/15 0555  CALCIUM 7.9* 8.1* 7.0*    CBC  Recent Labs Lab 08/13/15 0353  08/14/15 0333  08/14/15 2334 08/15/15 0555 08/15/15 0915  WBC 6.7  --  10.2  --   --  8.8  --   HGB 6.1*  < > 8.1*  < > 9.2*  9.3* 7.3* 6.9*  HCT 18.6*  < > 24.5*  < > 28.1*  27.7* 22.0* 21.0*  PLT 212  --  263  --   --  288  --   < > = values in this interval not displayed.  Coag's  Recent Labs Lab 08/10/15 2138  INR 1.16    Sepsis Markers No results for input(s): LATICACIDVEN, PROCALCITON, O2SATVEN in the last 168 hours.  ABG  Recent Labs Lab 08/14/15 2350  PHART 7.326*  PCO2ART 42.6  PO2ART 209*    Liver Enzymes  Recent Labs Lab 08/11/15 0743  AST 16  ALT 13*  ALKPHOS 38  BILITOT 1.1  ALBUMIN 3.2*    Cardiac Enzymes No results for input(s): TROPONINI, PROBNP in the last 168 hours.  Glucose  Recent Labs Lab 08/11/15 0753 08/12/15 0809 08/13/15 0932 08/14/15 0810 08/15/15 0015 08/15/15 0756  GLUCAP 98 88  119* 81 137* 182*    Imaging Dg Chest Port 1 View  Result Date: 08/14/2015 CLINICAL DATA:  45 year old male with endotracheal tube placement. EXAM: PORTABLE CHEST 1 VIEW COMPARISON:  None. FINDINGS: Endotracheal tube the tip approximately 4 cm above the carina. An enteric tube courses into the epigastric area with tip likely in the proximal stomach and the side-port in the distal esophagus. Recommend advancement of the tube into the stomach. Patchy airspace opacity predominantly involving the left mid to lower lung field most compatible with pneumonia. Right mid lung field streaky and nodular densities also concerning for developing infiltrate. There is no pleural effusion or pneumothorax. The cardiac silhouette is within normal limits. No acute osseous pathology. IMPRESSION: Endotracheal tube above the carina. Bilateral pulmonary airspace opacities predominantly involving the left mid to lower lung field most compatible with pneumonia. Clinical correlation and follow-up resolution recommended. Electronically Signed   By: Elgie Collard M.D.   On: 08/14/2015 23:14   Ct Angio Abdomen Pelvis  W &/or Wo Contrast  Result Date: 08/14/2015 CLINICAL DATA:  Emesis.  GI bleeding. EXAM: CTA ABDOMEN AND PELVIS wITHOUT AND WITH CONTRAST TECHNIQUE: Multidetector CT imaging of the abdomen and pelvis was performed using the standard protocol during bolus administration of intravenous contrast. Multiplanar reconstructed images and MIPs were obtained and reviewed to evaluate the vascular anatomy. CONTRAST:  100 mL of Isovue 370 COMPARISON:  None. FINDINGS: Infiltrate is seen in the lingula, left lower lobe, and medial right lower lobe. There is a small right-sided pleural effusion. The esophagus is dilated with fluid. No other acute abnormalities are identified in the lower chest. No free air. There is free fluid in the abdomen. The patient is status post gastric surgery. There is dilatation of the distal esophagus and  gastric remnants. The efferent limb is dilated from the gastric remnant to the anastomosis with afferent limb. There is a twisting of vessels and bowel at the region of the transition point as seen on series 4, image 73. The small bowel is completely decompressed beyond this transition point. A second twisting of mesenteric vessels is seen more proximally as seen on coronal images 70 through 76. There is also a loop of small bowel which extends into the right side of the pelvis which is thick walled extending from axial image 71 into the pelvis such as on axial image 114. There is no pneumatosis in this location. The colon is relatively decompressed and contains fluid consistent with diarrhea. The appendix is not visualized but there is no secondary evidence of appendicitis. The gallbladder is distended. The liver, spleen, adrenal glands, and kidneys are normal. The pancreas is normal as well. The portal vein is normal. The abdominal  aorta is non aneurysmal with no dissection. The proximal celiac, superior mesenteric, inferior mesenteric, and renal arteries are normal. There are 2 renal arteries on the right and 1 on the left. No adenopathy or mass in the pelvis. The bladder is normal. Prostate calcifications are noted. Degenerative changes are seen in the spine. Review of the MIP images confirms the above findings. IMPRESSION: 1. Pneumonia in the lingula and lung bases. 2. There is a small bowel obstruction with associated swirling/twisting of the mesenteric vessels in two locations, dilated loops of bowel, and an abnormal thick walled loop of bowel extending into the pelvis. The findings are highly concerning for an internal hernia resulting in high-grade obstruction and the thickened loop of small bowel is very worrisome for an ischemic loop of small bowel. I discussed the findings with the patient's physician, Dr. Izola Price. Electronically Signed   By: Gerome Sam III M.D   On: 08/14/2015 17:46     STUDIES:   EGD 8/1: red spot at anastomosis treated with APC EGD 8/3: normal esophagus, normal gastric pouch, no active bleeding or stigmata of bleeding.  Colonoscopy 8/3: poor prep; no active bleeding; procedure aborted Port CXR 8/4: Endotracheal tube in good position. Enteric feeding tube coursing below diaphragm. Bilateral opacities left greater than right lower lung predominance.  MICROBIOLOGY: MRSA PCR 8/1:  Negative   ANTIBIOTICS: Zosyn 8/4 >>  SIGNIFICANT EVENTS: 7/31 - Admit 8/04 - Ex lap w/ reduction of internal hernia & resection  LINES/TUBES: OETT 7.5 8/4 >> FOLEY 8/4 >> PIV x2  ASSESSMENT / PLAN:  PULMONARY A: Need for Intubation - Done prior to surgery 8/4.  P:   Full Vent Support Holding on extubation until surgery complete Holding on SBT/WUA  CARDIOVASCULAR A:  Shock - Sepsis vs Hypovolemia.  P:  Monitoring on telemetry Vitals per unit protocol Continuing blood product & IVF resusucitation Trending Troponin I q6hr x3 Consider TTE if Troponin I is elevated Weaning Neo-Synephrine for MAP >65 Placing PICC or CVL for central access   RENAL A:   Acute Renal Failure - Baseline Creatinine ~1.0.  P:   Trending UOP with Foley Monitoring electrolytes and renal function daily Replacing electrolytes as indicated  GASTROINTESTINAL A:   Ischemic Bowel - S/P resection 8/4 w/ open abdomen. GI Bleed  H/O Peptic Ulcer H/O Roux-en-Y Gastric Bypass 2012 [facility]  P:   NPO Post-op care per Surgery Service Protonix IV q12hr  HEMATOLOGIC A:   Anemia - Secondary to blood loss w/ GIB. S/P 3u PRBC 8/1; 1u PRBC 8/2; & 2u PRBC 8/3.  P:  Transfusing 2u PRBC Checking Coags now Trending cell counts q8hr Transfuse for Hgb <7.0 or active bleeding SCDs  INFECTIOUS A:   Possible Sepsis - Secondary to ischemic bowel.  P:   Empiric Zosyn Day #2 Trending Procalcitonin per algorithm  ENDOCRINE A:   Hyperglycemia - No h/o DM.  P:   Checking Hgb A1c SSI per Sensitive  Algorithm Accu-Checks q4hr  NEUROLOGIC A:   Post-Operative Pain Sedation on Ventilator  P:   RASS goal: -3 to -4 Propofol gtt Fentanyl gtt & IV prn for Pain Versed IV prn Sedation  FAMILY  - Updates: No family at bedside 8/5.  - Inter-disciplinary family meet or Palliative Care meeting due by:  8/11  TODAY'S SUMMARY:  45 y.o. 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 Hypotension is gradually improving  but with limited IV access patient will need either a PICC line or central venous catheter. Continuing with 2 units of packed red blood cells total for blood product resuscitation. Continuing to trend hemoglobin/hematocrit every 8 hours. Also checking coags given his prior transfusions. Continuing broad-spectrum antibiotic coverage with Zosyn. Trending Procalcitonin with low threshold to broaden antibiotics further.  I spent a total of 34 minutes of critical care time this morning caring for the patient and reviewing the patient's electronic medical record.  Donna Christen Jamison Neighbor, M.D. [facility] Pager:  [phone] After 3pm or if no response, call [phone] 08/15/2015, 11:01 AM

## 2015-08-15 NOTE — Procedures (Signed)
Arterial Catheter Insertion Procedure Note Walter Shields [id number] 1970-05-28  Procedure: Insertion of Arterial Catheter  Indications: Blood pressure monitoring  Procedure Details Consent: Unable to obtain consent because of emergent medical necessity. and patient intubated  Time Out: Verified patient identification, verified procedure, site/side was marked, verified correct patient position, special equipment/implants available, medications/allergies/relevent history reviewed, required imaging and test results available.  Performed  Maximum sterile technique was used including antiseptics, cap, gloves, gown, hand hygiene, mask and sheet. Skin prep: Chlorhexidine; local anesthetic administered 20 gauge catheter was inserted into left radial artery using the Seldinger technique.  Evaluation Blood flow good; BP tracing good. Complications: No apparent complications   A-line inserted to monitor BP per order.   Walter Shields 08/15/2015

## 2015-08-15 NOTE — Progress Notes (Signed)
Patient ID: Walter Shields, male   DOB: 1970-05-12, 45 y.o.   MRN: [id number] 1 Day Post-Op  Subjective: Sedated on Ventilator  Objective: Vital signs in last 24 hours: Temp:  [97.3 F (36.3 C)-99.2 F (37.3 C)] 97.7 F (36.5 C) (08/05 0405) Pulse Rate:  [73-125] 80 (08/05 0700) Resp:  [8-41] 15 (08/05 0700) BP: (65-163)/(47-96) 85/62 (08/05 0400) SpO2:  [97 %-100 %] 100 % (08/05 0700) Arterial Line BP: (59-121)/(43-63) 95/51 (08/05 0700) FiO2 (%):  [30 %-50 %] 30 % (08/05 0700) Weight:  [76.2 kg (168 lb)-80.8 kg (178 lb 2.1 oz)] 80.8 kg (178 lb 2.1 oz) (08/05 0500) Last BM Date: 08/13/15  Intake/Output from previous day: 08/04 0701 - 08/05 0700 In: 5506.8 [I.V.:2956.8; IV Piggyback:2550] Out: 2205 [Urine:555; Drains:850; Blood:100] Intake/Output this shift: No intake/output data recorded.  General appearance: sedated on ventilator and not responsive Resp: clear to auscultation bilaterally GI: nondistended. Extremities: warm and well perfused Incision/Wound: VAC dressing in place and intact. No unusual drainage.  Lab Results:   Recent Labs  08/14/15 0333  08/14/15 2334 08/15/15 0555  WBC 10.2  --   --  8.8  HGB 8.1*  < > 9.2*  9.3* 7.3*  HCT 24.5*  < > 28.1*  27.7* 22.0*  PLT 263  --   --  288  < > = values in this interval not displayed. BMET  Recent Labs  08/14/15 0333 08/15/15 0555  NA 138 138  K 3.8 3.9  CL 107 109  CO2 23 22  GLUCOSE 79 167*  BUN 21* 29*  CREATININE 0.94 1.31*  CALCIUM 8.1* 7.0*     Studies/Results: Dg Chest Port 1 View  Result Date: 08/14/2015 CLINICAL DATA:  45 year old male with endotracheal tube placement. EXAM: PORTABLE CHEST 1 VIEW COMPARISON:  None. FINDINGS: Endotracheal tube the tip approximately 4 cm above the carina. An enteric tube courses into the epigastric area with tip likely in the proximal stomach and the side-port in the distal esophagus. Recommend advancement of the tube into the stomach. Patchy airspace  opacity predominantly involving the left mid to lower lung field most compatible with pneumonia. Right mid lung field streaky and nodular densities also concerning for developing infiltrate. There is no pleural effusion or pneumothorax. The cardiac silhouette is within normal limits. No acute osseous pathology. IMPRESSION: Endotracheal tube above the carina. Bilateral pulmonary airspace opacities predominantly involving the left mid to lower lung field most compatible with pneumonia. Clinical correlation and follow-up resolution recommended. Electronically Signed   By: Elgie Collard M.D.   On: 08/14/2015 23:14   Ct Angio Abdomen Pelvis  W &/or Wo Contrast  Result Date: 08/14/2015 CLINICAL DATA:  Emesis.  GI bleeding. EXAM: CTA ABDOMEN AND PELVIS wITHOUT AND WITH CONTRAST TECHNIQUE: Multidetector CT imaging of the abdomen and pelvis was performed using the standard protocol during bolus administration of intravenous contrast. Multiplanar reconstructed images and MIPs were obtained and reviewed to evaluate the vascular anatomy. CONTRAST:  100 mL of Isovue 370 COMPARISON:  None. FINDINGS: Infiltrate is seen in the lingula, left lower lobe, and medial right lower lobe. There is a small right-sided pleural effusion. The esophagus is dilated with fluid. No other acute abnormalities are identified in the lower chest. No free air. There is free fluid in the abdomen. The patient is status post gastric surgery. There is dilatation of the distal esophagus and gastric remnants. The efferent limb is dilated from the gastric remnant to the anastomosis with afferent limb. There is a twisting  of vessels and bowel at the region of the transition point as seen on series 4, image 73. The small bowel is completely decompressed beyond this transition point. A second twisting of mesenteric vessels is seen more proximally as seen on coronal images 70 through 76. There is also a loop of small bowel which extends into the right side  of the pelvis which is thick walled extending from axial image 71 into the pelvis such as on axial image 114. There is no pneumatosis in this location. The colon is relatively decompressed and contains fluid consistent with diarrhea. The appendix is not visualized but there is no secondary evidence of appendicitis. The gallbladder is distended. The liver, spleen, adrenal glands, and kidneys are normal. The pancreas is normal as well. The portal vein is normal. The abdominal aorta is non aneurysmal with no dissection. The proximal celiac, superior mesenteric, inferior mesenteric, and renal arteries are normal. There are 2 renal arteries on the right and 1 on the left. No adenopathy or mass in the pelvis. The bladder is normal. Prostate calcifications are noted. Degenerative changes are seen in the spine. Review of the MIP images confirms the above findings. IMPRESSION: 1. Pneumonia in the lingula and lung bases. 2. There is a small bowel obstruction with associated swirling/twisting of the mesenteric vessels in two locations, dilated loops of bowel, and an abnormal thick walled loop of bowel extending into the pelvis. The findings are highly concerning for an internal hernia resulting in high-grade obstruction and the thickened loop of small bowel is very worrisome for an ischemic loop of small bowel. I discussed the findings with the patient's physician, Dr. Izola Price. Electronically Signed   By: Gerome Sam III M.D   On: 08/14/2015 17:46    Anti-infectives: Anti-infectives    Start     Dose/Rate Route Frequency Ordered Stop   08/15/15 0400  piperacillin-tazobactam (ZOSYN) IVPB 3.375 g     3.375 g 12.5 mL/hr over 240 Minutes Intravenous Every 8 hours 08/14/15 2247     08/14/15 1900  piperacillin-tazobactam (ZOSYN) IVPB 3.375 g  Status:  Discontinued     3.375 g 12.5 mL/hr over 240 Minutes Intravenous NOW 08/14/15 1847 08/14/15 1857   08/14/15 1900  piperacillin-tazobactam (ZOSYN) IVPB 3.375 g     3.375  g 100 mL/hr over 30 Minutes Intravenous  Once 08/14/15 1857 08/14/15 1930      Assessment/Plan: s/p Procedure(s): EXPLORATORY LAPAROTOMY ANASTOMOSIS OF ILLEUM LAPAROSCOPIC ABDOMINAL EXPLORATION REDUCTION OF INTERNAL HERNIA Hypotensive overnight despite fluid boluses and near drip. Cuff pressure somewhat higher. Seems well perfused. Mild ARI. I suspect he may still be volume depleted and might expect large volume shifts after bowel reperfusion. Possible sepsis. On broad-spectrum IV antibiotics. I will repeat fluid bolus and give one unit of packed cells. I wonder if he would benefit from central pressure monitoring- will leave decision to CCM. Tentatively planning abdominal reexploration tomorrow to assess ischemic Roux limb. Hypotension will need to be resolved before we can safely do this.   LOS: 4 days    Avid Guillette T 08/15/2015

## 2015-08-15 NOTE — Progress Notes (Signed)
Progress Note   Subjective  Events noted from overnight. CT angio abdomen showed internal hernia with bowel obstruction / ischemic small bowel, went urgent to OR. Recovering in ICU, intubated. Nursing reports no bowel movements.    Objective   Vital signs in last 24 hours: Temp:  [97.3 F (36.3 C)-99.2 F (37.3 C)] 98.2 F (36.8 C) (08/05 1227) Pulse Rate:  [77-125] 83 (08/05 1212) Resp:  [14-41] 15 (08/05 1212) BP: (65-163)/(47-96) 116/77 (08/05 1200) SpO2:  [97 %-100 %] 100 % (08/05 1212) Arterial Line BP: (59-121)/(43-71) 111/70 (08/05 1212) FiO2 (%):  [30 %-50 %] 30 % (08/05 1212) Weight:  [168 lb (76.2 kg)-178 lb 2.1 oz (80.8 kg)] 178 lb 2.1 oz (80.8 kg) (08/05 0500) Last BM Date: 08/13/15 General:    white male intubated, sedated Heart:  Regular rate and rhythm; no murmurs Lungs: Respirations even and unlabored, lungs CTA bilaterally anteriorly Abdomen:  Soft, wound vac in place.   Intake/Output from previous day: 08/04 0701 - 08/05 0700 In: 5682.2 [I.V.:3132.2; IV Piggyback:2550] Out: 2205 [Urine:555; Drains:850; Blood:100] Intake/Output this shift: Total I/O In: 954.2 [I.V.:924.2; Blood:30] Out: 330 [Urine:200; Drains:130]  Lab Results:  Recent Labs  08/13/15 0353  08/14/15 0333  08/14/15 2334 08/15/15 0555 08/15/15 0915  WBC 6.7  --  10.2  --   --  8.8  --   HGB 6.1*  < > 8.1*  < > 9.2*  9.3* 7.3* 6.9*  HCT 18.6*  < > 24.5*  < > 28.1*  27.7* 22.0* 21.0*  PLT 212  --  263  --   --  288  --   < > = values in this interval not displayed. BMET  Recent Labs  08/13/15 0353 08/13/15 1634 08/14/15 0333 08/15/15 0555  NA 138 142 138 138  K 3.6 4.2 3.8 3.9  CL 112*  --  107 109  CO2 23  --  23 22  GLUCOSE 85 93 79 167*  BUN 36*  --  21* 29*  CREATININE 0.94  --  0.94 1.31*  CALCIUM 7.9*  --  8.1* 7.0*   LFT No results for input(s): PROT, ALBUMIN, AST, ALT, ALKPHOS, BILITOT, BILIDIR, IBILI in the last 72 hours. PT/INR No results for  input(s): LABPROT, INR in the last 72 hours.  Studies/Results: Dg Chest Port 1 View  Result Date: 08/14/2015 CLINICAL DATA:  45 year old male with endotracheal tube placement. EXAM: PORTABLE CHEST 1 VIEW COMPARISON:  None. FINDINGS: Endotracheal tube the tip approximately 4 cm above the carina. An enteric tube courses into the epigastric area with tip likely in the proximal stomach and the side-port in the distal esophagus. Recommend advancement of the tube into the stomach. Patchy airspace opacity predominantly involving the left mid to lower lung field most compatible with pneumonia. Right mid lung field streaky and nodular densities also concerning for developing infiltrate. There is no pleural effusion or pneumothorax. The cardiac silhouette is within normal limits. No acute osseous pathology. IMPRESSION: Endotracheal tube above the carina. Bilateral pulmonary airspace opacities predominantly involving the left mid to lower lung field most compatible with pneumonia. Clinical correlation and follow-up resolution recommended. Electronically Signed   By: Elgie Collard M.D.   On: 08/14/2015 23:14   Ct Angio Abdomen Pelvis  W &/or Wo Contrast  Result Date: 08/14/2015 CLINICAL DATA:  Emesis.  GI bleeding. EXAM: CTA ABDOMEN AND PELVIS wITHOUT AND WITH CONTRAST TECHNIQUE: Multidetector CT imaging of the abdomen and pelvis was performed using the  standard protocol during bolus administration of intravenous contrast. Multiplanar reconstructed images and MIPs were obtained and reviewed to evaluate the vascular anatomy. CONTRAST:  100 mL of Isovue 370 COMPARISON:  None. FINDINGS: Infiltrate is seen in the lingula, left lower lobe, and medial right lower lobe. There is a small right-sided pleural effusion. The esophagus is dilated with fluid. No other acute abnormalities are identified in the lower chest. No free air. There is free fluid in the abdomen. The patient is status post gastric surgery. There is  dilatation of the distal esophagus and gastric remnants. The efferent limb is dilated from the gastric remnant to the anastomosis with afferent limb. There is a twisting of vessels and bowel at the region of the transition point as seen on series 4, image 73. The small bowel is completely decompressed beyond this transition point. A second twisting of mesenteric vessels is seen more proximally as seen on coronal images 70 through 76. There is also a loop of small bowel which extends into the right side of the pelvis which is thick walled extending from axial image 71 into the pelvis such as on axial image 114. There is no pneumatosis in this location. The colon is relatively decompressed and contains fluid consistent with diarrhea. The appendix is not visualized but there is no secondary evidence of appendicitis. The gallbladder is distended. The liver, spleen, adrenal glands, and kidneys are normal. The pancreas is normal as well. The portal vein is normal. The abdominal aorta is non aneurysmal with no dissection. The proximal celiac, superior mesenteric, inferior mesenteric, and renal arteries are normal. There are 2 renal arteries on the right and 1 on the left. No adenopathy or mass in the pelvis. The bladder is normal. Prostate calcifications are noted. Degenerative changes are seen in the spine. Review of the MIP images confirms the above findings. IMPRESSION: 1. Pneumonia in the lingula and lung bases. 2. There is a small bowel obstruction with associated swirling/twisting of the mesenteric vessels in two locations, dilated loops of bowel, and an abnormal thick walled loop of bowel extending into the pelvis. The findings are highly concerning for an internal hernia resulting in high-grade obstruction and the thickened loop of small bowel is very worrisome for an ischemic loop of small bowel. I discussed the findings with the patient's physician, Dr. Izola Price. Electronically Signed   By: Gerome Sam III M.D    On: 08/14/2015 17:46       Assessment / Plan:   45 y/o male who initially presented with symptomatic anemia, melena. Underwent upper and lower endoscopies and tagged RBC scan without clear etiology, although no blood was noted in upper tract with significant blood / clots in colon. CT angio yesterday afernoon showed internal hernia with small bowel obstruction / ischemic bowel, went urgently to OR last night and had small bowel resection, repair of hernia, with some concern for other ischemic damage. This is very unusual presentation for GI bleeding given he did not develop pain until later in his hospital course, and previously only had bleeding symptoms. Hgb has trended down but no further blood per rectum. Hopefully following surgical therapy he has no further bleeding. Per surgery planned for re-examination in the OR tomorrow. Will follow peripherally for now, please let us know if he has overt bleeding or if our assistance is needed moving forward.   Ileene Patrick, MD [facility] Pager [phone]

## 2015-08-15 NOTE — Progress Notes (Signed)
Peripherally Inserted Central Catheter/Midline Placement  The IV Nurse has discussed with the patient and/or persons authorized to consent for the patient, the purpose of this procedure and the potential benefits and risks involved with this procedure.  The benefits include less needle sticks, lab draws from the catheter and patient may be discharged home with the catheter.  Risks include, but not limited to, infection, bleeding, blood clot (thrombus formation), and puncture of an artery; nerve damage and irregular heat beat.  Alternatives to this procedure were also discussed.  PICC/Midline Placement Documentation  PICC Triple Lumen 08/15/15 PICC Right Basilic 40 cm 0 cm (Active)  Indication for Insertion or Continuance of Line Vasoactive infusions 08/15/2015  5:01 PM  Exposed Catheter (cm) 0 cm 08/15/2015  5:01 PM  Site Assessment Clean;Dry;Intact 08/15/2015  5:01 PM  Lumen #1 Status Flushed;Saline locked;Blood return noted 08/15/2015  5:01 PM  Lumen #2 Status Flushed;Saline locked;Blood return noted 08/15/2015  5:01 PM  Lumen #3 Status Flushed;Saline locked;Blood return noted 08/15/2015  5:01 PM  Dressing Type Transparent 08/15/2015  5:01 PM  Dressing Status Clean;Dry;Intact 08/15/2015  5:01 PM  Dressing Change Due 08/22/15 08/15/2015  5:01 PM       Ethelda Chick 08/15/2015, 5:03 PM

## 2015-08-15 NOTE — Progress Notes (Signed)
CRITICAL VALUE ALERT  Critical value received:  Hgb=6.9  Date of notification:  08/15/2015  Time of notification:  1049  Critical value read back:Yes.    Nurse who received alert:  Eddie Candle RN  MD notified (1st page):  Dr. Jamison Neighbor  Time of first page:  1121  MD notified (2nd page):  Time of second page:  Responding MD:  Dr. Jamison Neighbor  Time MD responded:  [phone]

## 2015-08-16 ENCOUNTER — Encounter (HOSPITAL_COMMUNITY)
Admission: EM | Disposition: A | Discharge status: Home / Self Care | Payer: Self-pay | Source: Home / Self Care | Attending: Internal Medicine

## 2015-08-16 ENCOUNTER — Encounter (HOSPITAL_COMMUNITY): Payer: Self-pay | Admitting: Registered Nurse

## 2015-08-16 ENCOUNTER — Inpatient Hospital Stay (HOSPITAL_COMMUNITY): Payer: BLUE CROSS/BLUE SHIELD | Admitting: Anesthesiology

## 2015-08-16 DIAGNOSIS — R739 Hyperglycemia, unspecified: Secondary | ICD-10-CM

## 2015-08-16 HISTORY — PX: LAPAROTOMY: SHX154

## 2015-08-16 LAB — RENAL FUNCTION PANEL
ANION GAP: 4 — AB (ref 5–15)
Albumin: 2.1 g/dL — ABNORMAL LOW (ref 3.5–5.0)
BUN: 26 mg/dL — ABNORMAL HIGH (ref 6–20)
CO2: 24 mmol/L (ref 22–32)
Calcium: 7.3 mg/dL — ABNORMAL LOW (ref 8.9–10.3)
Chloride: 107 mmol/L (ref 101–111)
Creatinine, Ser: 1.04 mg/dL (ref 0.61–1.24)
GFR calc Af Amer: >60 mL/min (ref >60)
GFR calc non Af Amer: >60 mL/min (ref >60)
GLUCOSE: 91 mg/dL (ref 65–99)
PHOSPHORUS: 3.7 mg/dL (ref 2.5–4.6)
POTASSIUM: 4.1 mmol/L (ref 3.5–5.1)
SODIUM: 135 mmol/L (ref 135–145)

## 2015-08-16 LAB — GLUCOSE, CAPILLARY
GLUCOSE-CAPILLARY: 120 mg/dL — AB (ref 65–99)
Glucose-Capillary: 94 mg/dL (ref 65–99)
Glucose-Capillary: 100 mg/dL — ABNORMAL HIGH (ref 65–99)

## 2015-08-16 LAB — PROCALCITONIN: Procalcitonin: 16.95 ng/mL

## 2015-08-16 LAB — HEPATIC FUNCTION PANEL
ALBUMIN: 2.1 g/dL — AB (ref 3.5–5.0)
ALK PHOS: 39 U/L (ref 38–126)
ALT: 11 U/L — AB (ref 17–63)
AST: 24 U/L (ref 15–41)
BILIRUBIN DIRECT: 0.5 mg/dL (ref 0.1–0.5)
BILIRUBIN TOTAL: 1.4 mg/dL — AB (ref 0.3–1.2)
Indirect Bilirubin: 0.9 mg/dL (ref 0.3–0.9)
TOTAL PROTEIN: 4.4 g/dL — AB (ref 6.5–8.1)

## 2015-08-16 LAB — TROPONIN I

## 2015-08-16 LAB — MAGNESIUM: Magnesium: 1.9 mg/dL (ref 1.7–2.4)

## 2015-08-16 LAB — TRIGLYCERIDES: TRIGLYCERIDES: 84 mg/dL (ref <150)

## 2015-08-16 LAB — HEMOGLOBIN AND HEMATOCRIT, BLOOD
HEMATOCRIT: 24.7 % — AB (ref 39.0–52.0)
HEMOGLOBIN: 8.2 g/dL — AB (ref 13.0–17.0)

## 2015-08-16 SURGERY — LAPAROTOMY, EXPLORATORY
Anesthesia: General | Site: Abdomen

## 2015-08-16 MED ORDER — ROCURONIUM BROMIDE 100 MG/10ML IV SOLN
INTRAVENOUS | Status: DC | PRN
  Administered 2015-08-16: 50 mg via INTRAVENOUS
  Administered 2015-08-16: 20 mg via INTRAVENOUS

## 2015-08-16 MED ORDER — ONDANSETRON HCL 4 MG/2ML IJ SOLN
INTRAMUSCULAR | Status: AC
  Filled 2015-08-16: qty 2

## 2015-08-16 MED ORDER — FENTANYL CITRATE (PF) 100 MCG/2ML IJ SOLN
50.0000 ug | INTRAMUSCULAR | Status: DC | PRN
  Administered 2015-08-16 – 2015-08-18 (×8): 50 ug via INTRAVENOUS
  Filled 2015-08-16 (×7): qty 2

## 2015-08-16 MED ORDER — LIDOCAINE HCL (CARDIAC) 20 MG/ML IV SOLN
INTRAVENOUS | Status: AC
  Filled 2015-08-16: qty 5

## 2015-08-16 MED ORDER — LACTATED RINGERS IV SOLN
INTRAVENOUS | Status: DC | PRN
  Administered 2015-08-16: 09:00:00 via INTRAVENOUS

## 2015-08-16 MED ORDER — FENTANYL CITRATE (PF) 100 MCG/2ML IJ SOLN
INTRAMUSCULAR | Status: DC | PRN
  Administered 2015-08-16 (×2): 100 ug via INTRAVENOUS
  Administered 2015-08-16 (×2): 50 ug via INTRAVENOUS

## 2015-08-16 MED ORDER — MIDAZOLAM HCL 5 MG/5ML IJ SOLN
INTRAMUSCULAR | Status: DC | PRN
  Administered 2015-08-16: 2 mg via INTRAVENOUS

## 2015-08-16 MED ORDER — PROPOFOL 1000 MG/100ML IV EMUL: 0.0000 ug/kg/min | INTRAVENOUS | Status: DC

## 2015-08-16 MED ORDER — SODIUM CHLORIDE 0.9 % IV SOLN: INTRAVENOUS | Status: DC

## 2015-08-16 MED ORDER — DEXTROSE 5 % IV SOLN
30.0000 ug/min | INTRAVENOUS | Status: DC
  Filled 2015-08-16: qty 1

## 2015-08-16 MED ORDER — PROPOFOL 10 MG/ML IV BOLUS
INTRAVENOUS | Status: AC
  Filled 2015-08-16: qty 40

## 2015-08-16 MED ORDER — ROCURONIUM BROMIDE 100 MG/10ML IV SOLN
INTRAVENOUS | Status: AC
  Filled 2015-08-16: qty 1

## 2015-08-16 MED ORDER — 0.9 % SODIUM CHLORIDE (POUR BTL) OPTIME
TOPICAL | Status: DC | PRN
  Administered 2015-08-16 (×2): 1000 mL

## 2015-08-16 MED ORDER — FENTANYL CITRATE (PF) 250 MCG/5ML IJ SOLN
INTRAMUSCULAR | Status: AC
  Filled 2015-08-16: qty 5

## 2015-08-16 MED ORDER — FENTANYL CITRATE (PF) 100 MCG/2ML IJ SOLN
INTRAMUSCULAR | Status: AC
  Filled 2015-08-16: qty 2

## 2015-08-16 MED ORDER — ANTISEPTIC ORAL RINSE SOLUTION (CORINZ)
7.0000 mL | Freq: Four times a day (QID) | OROMUCOSAL | Status: DC
  Administered 2015-08-16 (×2): 7 mL via OROMUCOSAL

## 2015-08-16 MED ORDER — MIDAZOLAM HCL 2 MG/2ML IJ SOLN
INTRAMUSCULAR | Status: AC
  Filled 2015-08-16: qty 2

## 2015-08-16 MED ORDER — PIPERACILLIN-TAZOBACTAM 3.375 G IVPB
3.3750 g | Freq: Three times a day (TID) | INTRAVENOUS | Status: DC
  Administered 2015-08-16 – 2015-08-21 (×15): 3.375 g via INTRAVENOUS
  Filled 2015-08-16 (×17): qty 50

## 2015-08-16 MED ORDER — CHLORHEXIDINE GLUCONATE 0.12 % MT SOLN
15.0000 mL | Freq: Two times a day (BID) | OROMUCOSAL | Status: DC
  Administered 2015-08-16: 15 mL via OROMUCOSAL

## 2015-08-16 MED ORDER — CETYLPYRIDINIUM CHLORIDE 0.05 % MT LIQD
7.0000 mL | Freq: Two times a day (BID) | OROMUCOSAL | Status: DC
  Administered 2015-08-16 – 2015-08-20 (×7): 7 mL via OROMUCOSAL

## 2015-08-16 MED ORDER — PANTOPRAZOLE SODIUM 40 MG IV SOLR
40.0000 mg | Freq: Two times a day (BID) | INTRAVENOUS | Status: DC
  Administered 2015-08-16 – 2015-08-21 (×11): 40 mg via INTRAVENOUS
  Filled 2015-08-16 (×11): qty 40

## 2015-08-16 MED ORDER — LACTATED RINGERS IV SOLN
INTRAVENOUS | Status: DC
  Administered 2015-08-17 – 2015-08-18 (×4): via INTRAVENOUS
  Administered 2015-08-19: 1000 mL via INTRAVENOUS
  Administered 2015-08-19 – 2015-08-20 (×3): via INTRAVENOUS

## 2015-08-16 MED ORDER — ACETAMINOPHEN 650 MG RE SUPP
650.0000 mg | Freq: Four times a day (QID) | RECTAL | Status: DC | PRN
Start: 2015-08-16 — End: 2015-08-19

## 2015-08-16 MED ORDER — SODIUM CHLORIDE 0.9 % IV SOLN
25.0000 ug/h | INTRAVENOUS | Status: DC
  Filled 2015-08-16: qty 50

## 2015-08-16 SURGICAL SUPPLY — 34 items
APPLICATOR COTTON TIP 6IN STRL (MISCELLANEOUS) ×3 IMPLANT
BLADE EXTENDED COATED 6.5IN (ELECTRODE) IMPLANT
BLADE HEX COATED 2.75 (ELECTRODE) ×3 IMPLANT
COVER MAYO STAND STRL (DRAPES) IMPLANT
COVER SURGICAL LIGHT HANDLE (MISCELLANEOUS) ×3 IMPLANT
DRAPE LAPAROSCOPIC ABDOMINAL (DRAPES) ×3 IMPLANT
DRAPE WARM FLUID 44X44 (DRAPE) IMPLANT
DRSG OPSITE POSTOP 4X10 (GAUZE/BANDAGES/DRESSINGS) ×3 IMPLANT
ELECT REM PT RETURN 9FT ADLT (ELECTROSURGICAL) ×3
ELECTRODE REM PT RTRN 9FT ADLT (ELECTROSURGICAL) ×1 IMPLANT
GAUZE SPONGE 4X4 12PLY STRL (GAUZE/BANDAGES/DRESSINGS) ×3 IMPLANT
GLOVE BIOGEL PI IND STRL 7.0 (GLOVE) ×1 IMPLANT
GLOVE BIOGEL PI INDICATOR 7.0 (GLOVE) ×2
GOWN STRL REUS W/TWL LRG LVL3 (GOWN DISPOSABLE) ×3 IMPLANT
GOWN STRL REUS W/TWL XL LVL3 (GOWN DISPOSABLE) ×6 IMPLANT
HANDLE SUCTION POOLE (INSTRUMENTS) IMPLANT
KIT BASIN OR (CUSTOM PROCEDURE TRAY) ×3 IMPLANT
NS IRRIG 1000ML POUR BTL (IV SOLUTION) ×3 IMPLANT
PACK GENERAL/GYN (CUSTOM PROCEDURE TRAY) ×3 IMPLANT
SPONGE LAP 18X18 X RAY DECT (DISPOSABLE) ×3 IMPLANT
STAPLER VISISTAT 35W (STAPLE) ×3 IMPLANT
SUCTION POOLE HANDLE (INSTRUMENTS)
SUT PDS AB 1 CTX 36 (SUTURE) IMPLANT
SUT PDS AB 1 TP1 96 (SUTURE) ×3 IMPLANT
SUT SILK 2 0 (SUTURE)
SUT SILK 2 0 SH CR/8 (SUTURE) IMPLANT
SUT SILK 2-0 18XBRD TIE 12 (SUTURE) IMPLANT
SUT SILK 3 0 (SUTURE)
SUT SILK 3 0 SH CR/8 (SUTURE) ×3 IMPLANT
SUT SILK 3-0 18XBRD TIE 12 (SUTURE) IMPLANT
TOWEL OR 17X26 10 PK STRL BLUE (TOWEL DISPOSABLE) ×6 IMPLANT
TRAY FOLEY W/METER SILVER 14FR (SET/KITS/TRAYS/PACK) IMPLANT
TRAY FOLEY W/METER SILVER 16FR (SET/KITS/TRAYS/PACK) IMPLANT
YANKAUER SUCT BULB TIP NO VENT (SUCTIONS) ×3 IMPLANT

## 2015-08-16 NOTE — Transfer of Care (Signed)
Immediate Anesthesia Transfer of Care Note  Patient: Walter Shields  Procedure(s) Performed: Procedure(s): EXPLORATORY LAPAROTOMY WOUND EXPLORATION WOUND CLOSURE (N/A)  Patient Location: PACU  Anesthesia Type:General  Level of Consciousness: Patient remains intubated per anesthesia plan  Airway & Oxygen Therapy: Patient remains intubated per anesthesia plan  Post-op Assessment: Report given to RN and Post -op Vital signs reviewed and stable  Post vital signs: stable  Last Vitals:  Vitals:   08/16/15 0800 08/16/15 0900  BP:    Pulse:    Resp: 17 15  Temp: 36.8 C     Last Pain:  Vitals:   08/16/15 0800  TempSrc: Oral  PainSc:       Patients Stated Pain Goal: 4 (08/14/15 1653)  Complications: No apparent anesthesia complications

## 2015-08-16 NOTE — Anesthesia Postprocedure Evaluation (Signed)
Anesthesia Post Note  Patient: Walter Shields  Procedure(s) Performed: Procedure(s) (LRB): COLONOSCOPY (N/A)  Patient location during evaluation: PACU Anesthesia Type: MAC Level of consciousness: awake and alert Pain management: pain level controlled Vital Signs Assessment: post-procedure vital signs reviewed and stable Respiratory status: spontaneous breathing, nonlabored ventilation, respiratory function stable and patient connected to nasal cannula oxygen Cardiovascular status: stable and blood pressure returned to baseline Anesthetic complications: no    Last Vitals:  Vitals:   08/16/15 0800 08/16/15 0900  BP:    Pulse:    Resp: 17 15  Temp: 36.8 C     Last Pain:  Vitals:   08/16/15 0800  TempSrc: Oral  PainSc:                  Bonita Quinichard S Juliani Laduke

## 2015-08-16 NOTE — Anesthesia Postprocedure Evaluation (Signed)
Anesthesia Post Note  Patient: Margaretha SeedsJames Symonds  Procedure(s) Performed: Procedure(s) (LRB): EXPLORATORY LAPAROTOMY WOUND EXPLORATION WOUND CLOSURE (N/A)  Patient location during evaluation: ICU Anesthesia Type: General Level of consciousness: patient remains intubated per anesthesia plan Vital Signs Assessment: post-procedure vital signs reviewed and stable Respiratory status: respiratory function stable and patient on ventilator - see flowsheet for VS Cardiovascular status: blood pressure returned to baseline and stable Anesthetic complications: no    Last Vitals:  Vitals:   08/16/15 0900 08/16/15 1200  BP:    Pulse:    Resp: 15   Temp:  37.1 C    Last Pain:  Vitals:   08/16/15 1200  TempSrc: Oral  PainSc:                  Phillips Groutarignan, Lou Irigoyen

## 2015-08-16 NOTE — Progress Notes (Signed)
PULMONARY / CRITICAL CARE MEDICINE   Name: Walter Shields MRN: [id number] DOB: Sep 29, 1970    ADMISSION DATE:  08/10/2015 CONSULTATION DATE:  08/14/15  REFERRING MD:  Izola Price, I  CHIEF COMPLAINT:  GI bleeding; incarcerated internal hernia; ischemic bowel  HISTORY OF PRESENT ILLNESS:   [name] is a 68M initially admitted 08/10/15 with complaints of melena and symptomatic anemia. He underwent EGD and colonoscopy, but no source of bleeding was identified. A CTA was obtained which revealed an internal hernia with incarcerated small bowel and likely bowel ischemia. He was taken emergently to the OR for exploratory laparotomy. A fully necrotic portion of small bowel was resected and the abdomen was left open for a second look in 36h to see if another portion of impaired bowel will recover or need to be resected. 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 Since admission he has received 6 units pRBCs.  On arrival to the ICU he is on the ventilator, tachycardic, and hypertensive. Paralytic is still on board. No family is available.   SUBJECTIVE: No acute events overnight. He underwent placement of right upper extremity PICC yesterday. Weaned off of vasopressors over the last 24 hours. Did received 2 units packed red blood cells yesterday.  REVIEW OF SYSTEMS:  Unable to obtain secondary to intubation and sedation.  VITAL SIGNS: BP 109/73   Pulse 79   Temp 98.8 F (37.1 C) (Axillary)   Resp 16   Ht  (1.702 m)   Wt 185 lb 3 oz (84 kg)   SpO2 100%   BMI 29.00 kg/m   HEMODYNAMICS:    VENTILATOR SETTINGS: Vent Mode: PRVC FiO2 (%):  [30 %] 30 % Set Rate:  [16 bmp] 16 bmp Vt Set:  [520 mL] 520 mL PEEP:  [5 cmH20] 5 cmH20 Plateau Pressure:  [6 cmH20-13 cmH20] 13 cmH20  INTAKE / OUTPUT: I/O last 3 completed shifts: In: 10268.8 [I.V.:6989.8; Blood:679; IV Piggyback:2600] Out: 3660 [Urine:1205; Emesis/NG output:200; Drains:1455; Other:700;  Blood:100]  PHYSICAL EXAMINATION: General:  Sedated. No family at bedside. No distress.  Integument:  Warm & dry. No rash on exposed skin. Abdominal incision with wound VAC in place. HEENT:  Moist mucus membranes. No scleral injection or icterus. Endotracheal tube in place.  Cardiovascular:  Regular rate. No edema. No appreciable JVD.  Pulmonary:  Good aeration & clear to auscultation bilaterally. Symmetric chest wall rise on ventilator. Abdomen: Soft. Normal bowel sounds. Nondistended. Neurological: Patient nods to questions. Sedated. Back to sleep easily. Moving all 4 extremities and grossly nonfocal.  LABS:  BMET  Recent Labs Lab 08/14/15 0333 08/15/15 0555 08/16/15 0500  NA 138 138 135  K 3.8 3.9 4.1  CL 107 109 107  CO2 BUN 21* 29* 26*  CREATININE 0.94 1.31* 1.04  GLUCOSE 79 167* 91    Electrolytes  Recent Labs Lab 08/14/15 0333 08/15/15 0555 08/16/15 0500  CALCIUM 8.1* 7.0* 7.3*  MG  --   --  1.9  PHOS  --   --  3.7    CBC  Recent Labs Lab 08/13/15 0353  08/14/15 0333  08/15/15 0555 08/15/15 0915 08/15/15 1500 08/16/15 0500  WBC 6.7  --  10.2  --  8.8  --   --   --   HGB 6.1*  < > 8.1*  < > 7.3* 6.9* 7.7* 8.2*  HCT 18.6*  < > 24.5*  < > 22.0* 21.0* 23.5* 24.7*  PLT 212  --  263  --  288  --   --   --   < > = values in this interval not displayed.  Coag's  Recent Labs Lab 08/10/15 2138 08/15/15 1500  APTT  --  42*  INR 1.16 1.64    Sepsis Markers  Recent Labs Lab 08/15/15 1500  PROCALCITON 20.97    ABG  Recent Labs Lab 08/14/15 2350  PHART 7.326*  PCO2ART 42.6  PO2ART 209*    Liver Enzymes  Recent Labs Lab 08/11/15 0743 08/16/15 0500  AST 16 24  ALT 13* 11*  ALKPHOS 38 39  BILITOT 1.1 1.4*  ALBUMIN 3.2* 2.1*  2.1*    Cardiac Enzymes  Recent Labs Lab 08/15/15 1500 08/15/15 2138 08/16/15 0300  TROPONINI <0.03 <0.03 <0.03    Glucose  Recent Labs Lab 08/15/15 0756 08/15/15 1217 08/15/15 1555  08/15/15 2045 08/15/15 2355 08/16/15 0620  GLUCAP 182* 158* 144* 121* 120* 94    Imaging No results found.   STUDIES:  EGD 8/1: red spot at anastomosis treated with APC EGD 8/3: normal esophagus, normal gastric pouch, no active bleeding or stigmata of bleeding.  Colonoscopy 8/3: poor prep; no active bleeding; procedure aborted Port CXR 8/4: Endotracheal tube in good position. Enteric feeding tube coursing below diaphragm. Bilateral opacities left greater than right lower lung predominance.  MICROBIOLOGY: MRSA PCR 8/1:  Negative   ANTIBIOTICS: Zosyn 8/4 >>  SIGNIFICANT EVENTS: 7/31 - Admit 8/04 - Ex lap w/ reduction of internal hernia & resection  LINES/TUBES: OETT 7.5 8/4 >> FOLEY 8/4 >> RUE PICC 8/5 >> PIV x2  ASSESSMENT / PLAN:  PULMONARY A: Need for Intubation - Done prior to surgery 8/4.  P:   Full Vent Support Holding on extubation until surgery complete Plan for SBT tomorrow morning if ok with Surgery Service & no planned procedures  CARDIOVASCULAR A:  Shock - Sepsis vs Hypovolemia. Resolved. Trop I negative.  P:  Monitoring on telemetry Vitals per unit protocol Goal MAP >65   RENAL A:   Acute Renal Failure - Resolving. Baseline Creatinine ~1.0.  P:   Trending UOP with Foley Monitoring electrolytes and renal function daily Replacing electrolytes as indicated  GASTROINTESTINAL A:   Ischemic Bowel - S/P resection 8/4 w/ open abdomen. GI Bleed  H/O Peptic Ulcer H/O Roux-en-Y Gastric Bypass 2012 [facility]  P:   NPO Post-op care per Surgery Service Plan for OR today with wound vac change vs abdominal closure Protonix IV q12hr  HEMATOLOGIC A:   Anemia - Secondary to blood loss w/ GIB. Hgb stable. S/P 3u PRBC 8/1; 1u PRBC 8/2; 2u PRBC 8/3; & 2u PRBC 8/5. Coagulopathy - Mild. Likely secondary to antibiotics & transfusions.  P:  Trending Hgb/Hct q12hr & cell counts daily Repeat Coags tomorrow AM Transfuse for Hgb <7.0 or active  bleeding SCDs  INFECTIOUS A:   Possible Sepsis - Secondary to ischemic bowel.  P:   Empiric Zosyn Day #3 Trending Procalcitonin per algorithm  ENDOCRINE A:   Hyperglycemia - No h/o DM. Glucose now controlled.  P:   Hgb A1c Pending SSI per Sensitive Algorithm Accu-Checks q4hr  NEUROLOGIC A:   Post-Operative Pain Sedation on Ventilator  P:   RASS goal: -3 to -4 Propofol gtt Fentanyl gtt & IV prn for Pain Versed IV prn Sedation Plan for Sedation Vacation tomorrow morning   FAMILY  - Updates: No family at bedside 8/6. Elfredia Nevins updated via phone 8/6 by Dr. Jamison Neighbor.  - Inter-disciplinary family meet or Palliative  Care meeting due by:  8/11  TODAY'S SUMMARY:  45 y.o. 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 Patient shock has resolved after blood product transfusion. Procalcitonin elevated therefore continuing broad-spectrum antibiotic coverage. Plan for surgery today. Patient will remain sedated. Plan for spontaneous breathing trial and wakeup assessment tomorrow morning. Holding on extubation until discussed with surgery and we determine if he will have any near future procedures.  I spent a total of 31 minutes of critical care time this morning caring for the patient and reviewing the patient's electronic medical record.  Donna ChristenJennings E. Jamison NeighborNestor, M.D. [facility] Pager:  [phone] After 3pm or if no response, call [phone] 08/16/2015, 7:56 AM

## 2015-08-16 NOTE — Anesthesia Preprocedure Evaluation (Signed)
Anesthesia Evaluation  Patient identified by MRN, date of birth, ID band Patient awake    Reviewed: Allergy & Precautions, NPO status , Patient's Chart, lab work & pertinent test results  Airway Mallampati: Intubated       Dental no notable dental hx.    Pulmonary neg pulmonary ROS, former smoker,    Pulmonary exam normal        Cardiovascular negative cardio ROS Normal cardiovascular exam Rhythm:Regular Rate:Normal     Neuro/Psych Syncopal episode thought secondary to acute blood loss anemia negative psych ROS   GI/Hepatic Neg liver ROS, PUD, Current suspected upper GI bleed S/p gastric bypass in 2012   Endo/Other  negative endocrine ROS  Renal/GU negative Renal ROS  negative genitourinary   Musculoskeletal   Abdominal Normal abdominal exam  (+)   Peds negative pediatric ROS (+)  Hematology  (+) Blood dyscrasia, anemia , S/p 8 units PRBCs. INR 1.6 yesterday. Will transfuse FFP during surgery today   Anesthesia Other Findings   Reproductive/Obstetrics                             Anesthesia Physical  Anesthesia Plan  ASA: II  Anesthesia Plan: General   Post-op Pain Management:    Induction: Intravenous  Airway Management Planned: Oral ETT  Additional Equipment:   Intra-op Plan:   Post-operative Plan: Post-operative intubation/ventilation  Informed Consent: I have reviewed the patients History and Physical, chart, labs and discussed the procedure including the risks, benefits and alternatives for the proposed anesthesia with the patient or authorized representative who has indicated his/her understanding and acceptance.   Dental advisory given  Plan Discussed with: CRNA and Anesthesiologist  Anesthesia Plan Comments:         Anesthesia Quick Evaluation

## 2015-08-16 NOTE — Progress Notes (Signed)
eLink Physician-Brief Progress Note Patient Name: Walter SeedsJames Shields DOB: 01/07/1971 MRN: [id number]   Date of Service  08/16/2015  HPI/Events of Note  Patient doing well with SBT post surgery  eICU Interventions  extubation     Intervention Category Major Interventions: Respiratory failure - evaluation and management  Henry RusselSMITH, Eian, P 08/16/2015, 4:34 PM

## 2015-08-16 NOTE — Progress Notes (Signed)
Called Blood Bank and informed them to have 2 units of blood on the shelf in case of emergent transfusion per Dr. Johna SheriffHoxworth.  Erick Blinksuchman, Edie Darley D, RN

## 2015-08-16 NOTE — Progress Notes (Signed)
Patient ID: Walter Shields, male   DOB: 03/07/1970, 45 y.o.   MRN: [id number] 2 Days Post-Op  Subjective: Sedated but responsive on vent. Nods to questions.  Comfortable  Objective: Vital signs in last 24 hours: Temp:  [97.8 F (36.6 C)-99 F (37.2 C)] 98.8 F (37.1 C) (08/06 0624) Pulse Rate:  [74-83] 79 (08/06 0419) Resp:  [14-19] 16 (08/06 0419) BP: (97-125)/(65-84) 109/73 (08/06 0419) SpO2:  [99 %-100 %] 100 % (08/06 0419) Arterial Line BP: (86-123)/(58-98) 104/71 (08/06 0325) FiO2 (%):  [30 %] 30 % (08/06 0419) Weight:  [84 kg (185 lb 3 oz)] 84 kg (185 lb 3 oz) (08/06 0500) Last BM Date: 08/13/15  Intake/Output from previous day: 08/05 0701 - 08/06 0700 In: 5086.7 [I.V.:4357.7; Blood:679; IV Piggyback:50] Out: 1605 [Urine:800; Emesis/NG output:200; Drains:605] Intake/Output this shift: No intake/output data recorded.  General appearance: Sedated but responsive  Resp: clear to auscultation bilaterally GI: normal findings: soft, non-tender Incision/Wound: VAC in place  Lab Results:   Recent Labs  08/14/15 0333  08/15/15 0555  08/15/15 1500 08/16/15 0500  WBC 10.2  --  8.8  --   --   --   HGB 8.1*  < > 7.3*  < > 7.7* 8.2*  HCT 24.5*  < > 22.0*  < > 23.5* 24.7*  PLT 263  --  288  --   --   --   < > = values in this interval not displayed. BMET  Recent Labs  08/14/15 0333 08/15/15 0555  NA 138 138  K 3.8 3.9  CL 107 109  CO2 23 22  GLUCOSE 79 167*  BUN 21* 29*  CREATININE 0.94 1.31*  CALCIUM 8.1* 7.0*     Studies/Results: Dg Chest Port 1 View  Result Date: 08/14/2015 CLINICAL DATA:  45 year old male with endotracheal tube placement. EXAM: PORTABLE CHEST 1 VIEW COMPARISON:  None. FINDINGS: Endotracheal tube the tip approximately 4 cm above the carina. An enteric tube courses into the epigastric area with tip likely in the proximal stomach and the side-port in the distal esophagus. Recommend advancement of the tube into the stomach. Patchy airspace  opacity predominantly involving the left mid to lower lung field most compatible with pneumonia. Right mid lung field streaky and nodular densities also concerning for developing infiltrate. There is no pleural effusion or pneumothorax. The cardiac silhouette is within normal limits. No acute osseous pathology. IMPRESSION: Endotracheal tube above the carina. Bilateral pulmonary airspace opacities predominantly involving the left mid to lower lung field most compatible with pneumonia. Clinical correlation and follow-up resolution recommended. Electronically Signed   By: Elgie CollardArash  Radparvar M.D.   On: 08/14/2015 23:14   Ct Angio Abdomen Pelvis  W &/or Wo Contrast  Result Date: 08/14/2015 CLINICAL DATA:  Emesis.  GI bleeding. EXAM: CTA ABDOMEN AND PELVIS wITHOUT AND WITH CONTRAST TECHNIQUE: Multidetector CT imaging of the abdomen and pelvis was performed using the standard protocol during bolus administration of intravenous contrast. Multiplanar reconstructed images and MIPs were obtained and reviewed to evaluate the vascular anatomy. CONTRAST:  100 mL of Isovue 370 COMPARISON:  None. FINDINGS: Infiltrate is seen in the lingula, left lower lobe, and medial right lower lobe. There is a small right-sided pleural effusion. The esophagus is dilated with fluid. No other acute abnormalities are identified in the lower chest. No free air. There is free fluid in the abdomen. The patient is status post gastric surgery. There is dilatation of the distal esophagus and gastric remnants. The efferent limb is dilated  from the gastric remnant to the anastomosis with afferent limb. There is a twisting of vessels and bowel at the region of the transition point as seen on series 4, image 73. The small bowel is completely decompressed beyond this transition point. A second twisting of mesenteric vessels is seen more proximally as seen on coronal images 70 through 76. There is also a loop of small bowel which extends into the right side  of the pelvis which is thick walled extending from axial image 71 into the pelvis such as on axial image 114. There is no pneumatosis in this location. The colon is relatively decompressed and contains fluid consistent with diarrhea. The appendix is not visualized but there is no secondary evidence of appendicitis. The gallbladder is distended. The liver, spleen, adrenal glands, and kidneys are normal. The pancreas is normal as well. The portal vein is normal. The abdominal aorta is non aneurysmal with no dissection. The proximal celiac, superior mesenteric, inferior mesenteric, and renal arteries are normal. There are 2 renal arteries on the right and 1 on the left. No adenopathy or mass in the pelvis. The bladder is normal. Prostate calcifications are noted. Degenerative changes are seen in the spine. Review of the MIP images confirms the above findings. IMPRESSION: 1. Pneumonia in the lingula and lung bases. 2. There is a small bowel obstruction with associated swirling/twisting of the mesenteric vessels in two locations, dilated loops of bowel, and an abnormal thick walled loop of bowel extending into the pelvis. The findings are highly concerning for an internal hernia resulting in high-grade obstruction and the thickened loop of small bowel is very worrisome for an ischemic loop of small bowel. I discussed the findings with the patient's physician, Dr. Izola Price. Electronically Signed   By: Gerome Sam III M.D   On: 08/14/2015 17:46    Anti-infectives: Anti-infectives    Start     Dose/Rate Route Frequency Ordered Stop   08/15/15 0400  piperacillin-tazobactam (ZOSYN) IVPB 3.375 g     3.375 g 12.5 mL/hr over 240 Minutes Intravenous Every 8 hours 08/14/15 2247     08/14/15 1900  piperacillin-tazobactam (ZOSYN) IVPB 3.375 g  Status:  Discontinued     3.375 g 12.5 mL/hr over 240 Minutes Intravenous NOW 08/14/15 1847 08/14/15 1857   08/14/15 1900  piperacillin-tazobactam (ZOSYN) IVPB 3.375 g     3.375  g 100 mL/hr over 30 Minutes Intravenous  Once 08/14/15 1857 08/14/15 1930      Assessment/Plan: s/p Procedure(s): EXPLORATORY LAPAROTOMY Ileocecectomy LAPAROSCOPIC ABDOMINAL EXPLORATION REDUCTION OF INTERNAL HERNIA Stable this AM Return to OR for re exploration for ischemic SB,  VAC change or closure of abdomen   LOS: 5 days    Kyandra Mcclaine T 08/16/2015

## 2015-08-16 NOTE — Progress Notes (Signed)
Initial Nutrition Assessment  INTERVENTION:   If patient expected to remain intubated for > 48 hours, provide nutrition support.  If needed, TF recommendations: Vital 1.5 @ 45 ml/hr.  30 ml Prostat TID This provides 1920 kcal, 118 g protein and 825 ml H2O.  RD will continue to monitor for plan.  NUTRITION DIAGNOSIS:   Inadequate oral intake related to inability to eat as evidenced by NPO status.  GOAL:   Patient will meet greater than or equal to 90% of their needs  MONITOR:   Vent status, Labs, Weight trends, Skin, I & O's  REASON FOR ASSESSMENT:   Ventilator    ASSESSMENT:   12M initially admitted 08/10/15 with complaints of melena and symptomatic anemia. He underwent EGD and colonoscopy, but no source of bleeding was identified. A CTA was obtained which revealed an internal hernia with incarcerated small bowel and likely bowel ischemia. He was taken emergently to the OR for exploratory laparotomy. A fully necrotic portion of small bowel was resected and the abdomen was left open for a second look in 36h to see if another portion of impaired bowel will recover or need to be resected. His PMH is significant for prior [facility]. Since admission he has received 6 units pRBCs.   Patient in room with RNs and other staff in room. Pt is s/p ex lap and wound closure 8/6. Pt to remain intubated today for any additional procedures that may be needed. No family present at time of visit.  Nutrition focused physical exam shows no sign of depletion of muscle mass or body fat.  Patient is currently intubated on ventilator support MV: 9.8 L/min Temp (24hrs), Avg:98.7 F (37.1 C), Min:98.2 F (36.8 C), Max:99 F (37.2 C)  Propofol: 10.1  Ml/hr - provides 267 fat kcal  Labs reviewed. Medications reviewed: weaned off vasopressors  Diet Order:     Skin:  Wound (see comment) (8/4 & 8/6 abdominal incisions, facial lacerations)  Last BM:  8/3  Height:    Ht Readings from Last 1 Encounters:  08/16/15 5\' 7"  (1.702 m)    Weight:   Wt Readings from Last 1 Encounters:  08/16/15 185 lb 3 oz (84 kg)    Ideal Body Weight:  67.2 kg  BMI:  Body mass index is 29 kg/m.  Estimated Nutritional Needs:   Kcal:  1923  Protein:  120-130g  Fluid:  1.9L/day  EDUCATION NEEDS:   No education needs identified at this time  Tilda FrancoLindsey Letanya Froh, MS, RD, LDN Pager: [phone] After Hours Pager: [phone]

## 2015-08-16 NOTE — Anesthesia Procedure Notes (Signed)
Procedure Name: Intubation Date/Time: 08/16/2015 10:15 AM Performed by: Illene SilverEVANS, Karla Vines E Pre-anesthesia Checklist: Patient identified, Emergency Drugs available, Suction available and Patient being monitored Patient Re-evaluated:Patient Re-evaluated prior to inductionOxygen Delivery Method: Circle system utilized Preoxygenation: Pre-oxygenation with 100% oxygen Tube type: Oral Tube size: 7.5 (Taper tube used for exchange) mm Number of attempts: 1 Airway Equipment and Method: Oral airway and Bougie stylet Placement Confirmation: ETT inserted through vocal cords under direct vision,  positive ETCO2 and breath sounds checked- equal and bilateral Secured at: 22 cm Tube secured with: Tape Dental Injury: Teeth and Oropharynx as per pre-operative assessment  Comments: ETT exchanged with taper ETT same size exchanged smoothly with Dr. Acey Lavarignan

## 2015-08-16 NOTE — Op Note (Signed)
Preoperative Diagnosis: Status post exploratory lap for internal hernia and bowel ischemia with open abdomen  Postoprative Diagnosis: Same  Procedure: Procedure(s): EXPLORATORY LAPAROTOMY And WOUND CLOSURE   Surgeon: Glenna FellowsHoxworth, Ghada Abbett T   Assistants: Darnell Levelodd Gerkin  Anesthesia:  General endotracheal anesthesia  Indications: Patient is 36 hours status post exploratory laparotomy  For an internal hernia with remote history of laparoscopic Roux-en-Y gastric bypass. At his previous laparotomy he had an infarcted area of terminal ileum and underwent ileocecectomy. The majority of the small bowel had herniated through a Peterson defect and apparently due to pressure on the Roux limb mesentery he had ischemic changes of his Roux limb with questionable viability.  I elected to leave his abdomen open with a VAC drainage  And reexplore to assess for viability today. He has been stable since his previous operation.    Procedure Detail:  Patient was brought to the operating room intubated and placed in the supine position on the operating table.  Gen. Anesthesia was induced. The outer VAC dressing was removed and the abdomen widely prepped with  Betadine and sterilely draped. Patient timeout was performed and correct procedure verified.The remainder of the abdominal VAC dressing was removed. There was no unusual fluid in the abdomen and no evidence of bleeding.  A thorough exploration was then performed. The Roux limb which had appeared significantly ischemic previously looked remarkably healthy. It was slightly dilated and edematous.  However it appeared perfectly viable. There was one small area about 1 cm in diameter  In the mid Roux limb  Which appeared  Bruised and slightly thin but viable. There was plenty of diameter to the bowel at this point and I did turn in the serosa with a few seromuscular 3-0 silk sutures. We carefully examined the entire small bowel including the entire Roux limb and  gastrojejunostomy, a fair limb, jejunojejunostomy and the entire common channel down to the Ileocolonic anastomosis.. The anastomosis appeared  Healthy and no unusual  Fluid or drainage or blood.  I examined the Peterson  Defect to assess for closure but there were fairly dense inflammatory adhesions already closing the defect and I did not feel that any further suturing was necessary. The viscera were returned to their anatomic position. The abdomen was thoroughly irrigated with saline. The midline fascia was then closed with running looped #1 PDS begun at either end of the incision and tied centrally. Subcutaneous tissue was irrigated and skin closed with staples. Sponge needle and instrument counts were correct.    Findings: As above  Estimated Blood Loss:  none         Drains: none  Blood Given: none          Specimens: none        Complications:  * No complications entered in OR log *         Disposition: ICU - intubated and hemodynamically stable.         Condition: stable

## 2015-08-17 ENCOUNTER — Encounter (HOSPITAL_COMMUNITY): Payer: Self-pay | Admitting: Gastroenterology

## 2015-08-17 DIAGNOSIS — D689 Coagulation defect, unspecified: Secondary | ICD-10-CM

## 2015-08-17 LAB — PREPARE FRESH FROZEN PLASMA: UNIT DIVISION: 0

## 2015-08-17 LAB — HEMOGLOBIN A1C
HEMOGLOBIN A1C: 5.2 % (ref 4.8–5.6)
MEAN PLASMA GLUCOSE: 103 mg/dL

## 2015-08-17 MED ORDER — ENOXAPARIN SODIUM 40 MG/0.4ML ~~LOC~~ SOLN
40.0000 mg | SUBCUTANEOUS | Status: DC
  Administered 2015-08-17 – 2015-08-19 (×3): 40 mg via SUBCUTANEOUS
  Filled 2015-08-17 (×4): qty 0.4

## 2015-08-17 MED ORDER — ALPRAZOLAM 0.25 MG PO TABS: 0.2500 mg | ORAL_TABLET | Freq: Once | ORAL | Status: DC

## 2015-08-17 NOTE — Progress Notes (Signed)
[facility]:  [phone] General Surgery Progress Note   LOS: 6 days  POD -  1 Day Post-Op  Assessment/Plan: 1.  Laparoscopy, laparotomy with reduction of internal hernia, ileocecectomy with anastomosis and placement of abdominal  VAC dressing - 08/14/2015 - Hoxworth EXPLORATORY LAPAROTOMY WOUND EXPLORATION WOUND CLOSURE - 08/17/2015 - Hoxworth  For internal hernia with ischemic bowel  On Zosyn  In ICU  Has NGT - on ice chips only  2.  History of RYGB - 2012  Successful weight loss of 300+ pounds 3.  Anemia  Hgb - 8.2 on 08/16/2015 4.  DVT prophylaxis - to start Lovenox 5.  Foley - to d/c   Principal Problem:   Acute GI bleeding Active Problems:   Syncope   Symptomatic anemia   History of bleeding peptic ulcer   Lower GI bleeding   Subjective:  Doing well.  He thinks that he has passed some flatus  Objective:   Vitals:   08/17/15 0600 08/17/15 0834  BP: (!) 147/98 (!) 141/103  Pulse: 98   Resp: 19   Temp:       Intake/Output from previous day:  08/06 0701 - 08/07 0700 In: 4010.1 [I.V.:3541.1; Blood:319; IV Piggyback:150] Out: 2050 [Urine:1900; Emesis/NG output:100; Blood:50]  Intake/Output this shift:  Total I/O In: -  Out: 250 [Urine:250]   Physical Exam:   General: WN WM who is alert and oriented.    HEENT: Normal. Pupils equal. .   Lungs: Clear   Abdomen: Soft.  Has BS.   Wound: Clean.   Neurologic:  Grossly intact to motor and sensory function.   Psychiatric: Has normal mood and affect.   Lab Results:    Recent Labs  08/15/15 0555  08/15/15 1500 08/16/15 0500  WBC 8.8  --   --   --   HGB 7.3*  < > 7.7* 8.2*  HCT 22.0*  < > 23.5* 24.7*  PLT 288  --   --   --   < > = values in this interval not displayed.  BMET   Recent Labs  08/15/15 0555 08/16/15 0500  NA 138 135  K 3.9 4.1  CL 109 107  CO2 22 24  GLUCOSE 167* 91  BUN 29* 26*  CREATININE 1.31* 1.04  CALCIUM 7.0* 7.3*    PT/INR   Recent Labs  08/15/15 1500   LABPROT 19.6*  INR 1.64    ABG   Recent Labs  08/14/15 2350  PHART 7.326*  HCO3 21.6     Studies/Results:  No results found.   Anti-infectives:   Anti-infectives    Start     Dose/Rate Route Frequency Ordered Stop   08/16/15 1400  piperacillin-tazobactam (ZOSYN) IVPB 3.375 g     3.375 g 12.5 mL/hr over 240 Minutes Intravenous Every 8 hours 08/16/15 1242     08/15/15 0400  piperacillin-tazobactam (ZOSYN) IVPB 3.375 g  Status:  Discontinued     3.375 g 12.5 mL/hr over 240 Minutes Intravenous Every 8 hours 08/14/15 2247 08/16/15 1101   08/14/15 1900  piperacillin-tazobactam (ZOSYN) IVPB 3.375 g  Status:  Discontinued     3.375 g 12.5 mL/hr over 240 Minutes Intravenous NOW 08/14/15 1847 08/14/15 1857   08/14/15 1900  piperacillin-tazobactam (ZOSYN) IVPB 3.375 g     3.375 g 100 mL/hr over 30 Minutes Intravenous  Once 08/14/15 1857 08/14/15 1930      Ovidio Kinavid Audie Wieser, MD, FACS Pager: [phone] [facility]: [phone] 08/17/2015

## 2015-08-17 NOTE — Progress Notes (Signed)
eLink Physician-Brief Progress Note Patient Name: Margaretha SeedsJames Bartles DOB: 01/15/1970 MRN: [id number]  Post op ischemic bowel. Now sudden large BM that is bloody  Cam exam - stable. RN palpatin of abd - no tenderness. Incision site ok Large bloody bm seen in bed  PLAN RN to inform ccs as fyi for now Check cbc, bmet, lactate, ck, lipase, type and screen   Intervention Category Intermediate Interventions: Bleeding - evaluation and treatment with blood products  Lochlin Eppinger 08/17/2015, 11:35 PM

## 2015-08-17 NOTE — Progress Notes (Signed)
eLink Physician-Brief Progress Note Patient Name: Walter Shields DOB: 04/27/1970 MRN: [id number]   Date of Service  08/17/2015  HPI/Events of Note  Anxiety.  eICU Interventions  Will order: 1. Xanax 0.25 mg PO X 1 now.      Intervention Category Minor Interventions: Agitation / anxiety - evaluation and management  Sommer,Steven Eugene 08/17/2015, 10:31 PM

## 2015-08-17 NOTE — Progress Notes (Signed)
PULMONARY / CRITICAL CARE MEDICINE   Name: Walter Shields MRN: [id number] DOB: 01-Apr-1970    ADMISSION DATE:  08/10/2015 CONSULTATION DATE:  08/14/15  REFERRING MD:  Izola Price, I  CHIEF COMPLAINT:  GI bleeding; incarcerated internal hernia; ischemic bowel  HISTORY OF PRESENT ILLNESS:   Mr. Walter Shields is a 45M initially admitted 08/10/15 with complaints of melena and symptomatic anemia. He underwent EGD and colonoscopy, but no source of bleeding was identified. A CTA was obtained which revealed an internal hernia with incarcerated small bowel and likely bowel ischemia. He was taken emergently to the OR for exploratory laparotomy. A fully necrotic portion of small bowel was resected and the abdomen was left open for a second look in 36h to see if another portion of impaired bowel will recover or need to be resected. His PMH is significant for prior Roux-en-Y Gastric Bypass at [facility] in 2012. Since admission he has received 6 units pRBCs.  On arrival to the ICU he is on the ventilator, tachycardic, and hypertensive. Paralytic is still on board. No family is available.   SUBJECTIVE: Patient successfully extubated postoperatively yesterday. Abdominal wound was closed yesterday. Reports intermittent coughing but denies any dyspnea. Reports abdominal pain 3/10. Denies any nausea. Please he did have some flatus. Patient has gotten out of bed to chair today.  REVIEW OF SYSTEMS:  No subjective fever, chills, or sweats. No chest pain or pressure. No headache or vision changes.  VITAL SIGNS: BP (!) 135/94   Pulse 98   Temp 100.3 F (37.9 C) (Oral)   Resp 19   Ht  (1.702 m)   Wt 185 lb 3 oz (84 kg)   SpO2 99%   BMI 29.00 kg/m   HEMODYNAMICS:    VENTILATOR SETTINGS: Vent Mode: PSV FiO2 (%):  [30 %] 30 % PEEP:  [5 cmH20] 5 cmH20 Pressure Support:  [5 cmH20] 5 cmH20  INTAKE / OUTPUT: I/O last 3 completed shifts: In: 7018.1 [I.V.:6084.1; Blood:684; IV Piggyback:250] Out: 2475 [Urine:2150;  Emesis/NG output:200; Drains:75; Blood:50]  PHYSICAL EXAMINATION: General:  Awake. No distress. Watching TV.  Integument:  Warm & dry. No rash on exposed skin. Abdominal incision clean, dry, and intact. HEENT:  Moist mucus membranes. No scleral icterus. NG tube in place. Cardiovascular:  Regular rate. Trace edema. Normal S1 & S2 Pulmonary:  Normal work of breathing on room air. Speaking in complete sentences. Slightly diminished breath sounds bilateral lung bases. Abdomen: Soft. Hypoactive bowel sounds. Nondistended. Neurological: Alert and oriented 4. Moving all 4 extremities and following commands.  LABS:  BMET  Recent Labs Lab 08/14/15 0333 08/15/15 0555 08/16/15 0500  NA 138 138 135  K 3.8 3.9 4.1  CL 107 109 107  CO2 BUN 21* 29* 26*  CREATININE 0.94 1.31* 1.04  GLUCOSE 79 167* 91    Electrolytes  Recent Labs Lab 08/14/15 0333 08/15/15 0555 08/16/15 0500  CALCIUM 8.1* 7.0* 7.3*  MG  --   --  1.9  PHOS  --   --  3.7    CBC  Recent Labs Lab 08/13/15 0353  08/14/15 0333  08/15/15 0555 08/15/15 0915 08/15/15 1500 08/16/15 0500  WBC 6.7  --  10.2  --  8.8  --   --   --   HGB 6.1*  < > 8.1*  < > 7.3* 6.9* 7.7* 8.2*  HCT 18.6*  < > 24.5*  < > 22.0* 21.0* 23.5* 24.7*  PLT 212  --  263  --  288  --   --   --   < > = values in this interval not displayed.  Coag's  Recent Labs Lab 08/10/15 2138 08/15/15 1500  APTT  --  42*  INR 1.16 1.64    Sepsis Markers  Recent Labs Lab 08/15/15 1500 08/16/15 0500  PROCALCITON 20.97 16.95    ABG  Recent Labs Lab 08/14/15 2350  PHART 7.326*  PCO2ART 42.6  PO2ART 209*    Liver Enzymes  Recent Labs Lab 08/11/15 0743 08/16/15 0500  AST 16 24  ALT 13* 11*  ALKPHOS 38 39  BILITOT 1.1 1.4*  ALBUMIN 3.2* 2.1*  2.1*    Cardiac Enzymes  Recent Labs Lab 08/15/15 1500 08/15/15 2138 08/16/15 0300  TROPONINI <0.03 <0.03 <0.03    Glucose  Recent Labs Lab 08/15/15 1217  08/15/15 1555 08/15/15 2045 08/15/15 2355 08/16/15 0620 08/16/15 0807  GLUCAP 158* 144* 121* 120* 94 100*    Imaging No results found.   STUDIES:  EGD 8/1: red spot at anastomosis treated with APC EGD 8/3: normal esophagus, normal gastric pouch, no active bleeding or stigmata of bleeding.  Colonoscopy 8/3: poor prep; no active bleeding; procedure aborted Port CXR 8/4: Endotracheal tube in good position. Enteric feeding tube coursing below diaphragm. Bilateral opacities left greater than right lower lung predominance.  MICROBIOLOGY: MRSA PCR 8/1:  Negative   ANTIBIOTICS: Zosyn 8/4 >>  SIGNIFICANT EVENTS: 7/31 - Admit 8/04 - Ex lap w/ reduction of internal hernia & resection 8/06 - Abdominal wound closed & patient extubated post-op  LINES/TUBES: OETT 7.5 8/4 - 8/6 RUE PICC 8/5 >> PIV x2  ASSESSMENT / PLAN:  PULMONARY A: No acute issues.  P:   Incentive spirometry for pulmonary toilette OOB  CARDIOVASCULAR A:  Shock - Sepsis vs Hypovolemia. Resolved. Trop I negative.  P:  Monitoring on telemetry Vitals per unit protocol Goal MAP >65   RENAL A:   Acute Renal Failure - Resolving. Baseline Creatinine ~1.0.  P:   Trending UOP  Monitoring electrolytes and renal function daily Replacing electrolytes as indicated  GASTROINTESTINAL A:   Ischemic Bowel - S/P resection 8/4 w/ open abdomen & closure on 8/6. GI Bleed  H/O Peptic Ulcer H/O Roux-en-Y Gastric Bypass 2012 [facility]  P:   NPO except ice chips Post-op care per Surgery Service Protonix IV q12hr  HEMATOLOGIC A:   Anemia - Secondary to blood loss w/ GIB. Hgb stable. S/P 3u PRBC 8/1; 1u PRBC 8/2; 2u PRBC 8/3; & 2u PRBC 8/5. Coagulopathy - Mild. Likely secondary to antibiotics & transfusions.  P:  Trending cell counts daily w/ CBC Repeat Coags tomorrow AM Transfuse for Hgb <7.0 or active bleeding SCDs Lovenox [place] daily  INFECTIOUS A:   Sepsis - Secondary to ischemic bowel.  P:   Empiric  Zosyn Day #4 Trending Procalcitonin per algorithm  ENDOCRINE A:   Hyperglycemia - No h/o DM. Glucose now controlled. A1c 5.2.  P:   SSI per Sensitive Algorithm Accu-Checks q4hr  NEUROLOGIC A:   Post-Operative Pain  P:   Fentanyl IV prn  FAMILY  - Updates: No family at bedside 8/7. Patient updated at bedside.  TODAY'S SUMMARY:  45 y.o. 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 Patient shock has resolved after blood product transfusion. Patient continuing on broad-spectrum antibiotic coverage given elevated Procalcitonin and sepsis likely secondary to ischemic bowel and enteric  bacterial translocation. Respiratory status remained stable postextubation. Patient's renal function seems to have stabilized from his acute renal failure. Continuing pain control with fentanyl IV when necessary. Patient is getting out of bed to chair. We will defer to surgery service on advancing diet. Hopefully bowel function will recover quickly. Hyperglycemia has also resolved and it's unlikely he has underlying diabetes with hemoglobin A1c of 5.2. As the patient continues to clinically improve we will transition him to a stepdown bed. TRH to assume care & PCCM signing off as of 8/8.  Donna ChristenJennings E. Jamison NeighborNestor, M.D. [facility] Pager:  [phone] After 3pm or if no response, call [phone] 08/17/2015, 12:49 PM

## 2015-08-18 LAB — TYPE AND SCREEN
ABO/RH(D): O POS
ABO/RH(D): O POS
ANTIBODY SCREEN: NEGATIVE
Antibody Screen: NEGATIVE
UNIT DIVISION: 0
UNIT DIVISION: 0
UNIT DIVISION: 0
Unit division: 0

## 2015-08-18 LAB — CBC WITH DIFFERENTIAL/PLATELET
BASOS ABS: 0 10*3/uL (ref 0.0–0.1)
BASOS PCT: 0 %
Basophils Absolute: 0 10*3/uL (ref 0.0–0.1)
Basophils Relative: 0 %
EOS PCT: 1 %
Eosinophils Absolute: 0.1 10*3/uL (ref 0.0–0.7)
Eosinophils Absolute: 0.1 10*3/uL (ref 0.0–0.7)
Eosinophils Relative: 1 %
HEMATOCRIT: 24.2 % — AB (ref 39.0–52.0)
HEMATOCRIT: 24.9 % — AB (ref 39.0–52.0)
HEMOGLOBIN: 8 g/dL — AB (ref 13.0–17.0)
Hemoglobin: 8.2 g/dL — ABNORMAL LOW (ref 13.0–17.0)
LYMPHS ABS: 0.8 10*3/uL (ref 0.7–4.0)
LYMPHS PCT: 7 %
Lymphocytes Relative: 8 %
Lymphs Abs: 0.9 10*3/uL (ref 0.7–4.0)
MCH: 26.7 pg (ref 26.0–34.0)
MCH: 26.9 pg (ref 26.0–34.0)
MCHC: 32.9 g/dL (ref 30.0–36.0)
MCHC: 33.1 g/dL (ref 30.0–36.0)
MCV: 80.7 fL (ref 78.0–100.0)
MCV: 81.6 fL (ref 78.0–100.0)
Monocytes Absolute: 1.2 10*3/uL — ABNORMAL HIGH (ref 0.1–1.0)
Monocytes Absolute: 1.4 10*3/uL — ABNORMAL HIGH (ref 0.1–1.0)
Monocytes Relative: 11 %
Monocytes Relative: 12 %
NEUTROS ABS: 8.6 10*3/uL — AB (ref 1.7–7.7)
NEUTROS PCT: 79 %
Neutro Abs: 9.6 10*3/uL — ABNORMAL HIGH (ref 1.7–7.7)
Neutrophils Relative %: 81 %
PLATELETS: 277 10*3/uL (ref 150–400)
Platelets: 316 10*3/uL (ref 150–400)
RBC: 3 MIL/uL — AB (ref 4.22–5.81)
RBC: 3.05 MIL/uL — AB (ref 4.22–5.81)
RDW: 18.5 % — AB (ref 11.5–15.5)
RDW: 18.6 % — ABNORMAL HIGH (ref 11.5–15.5)
WBC: 11 10*3/uL — AB (ref 4.0–10.5)
WBC: 11.8 10*3/uL — AB (ref 4.0–10.5)

## 2015-08-18 LAB — HEPATIC FUNCTION PANEL
ALBUMIN: 1.9 g/dL — AB (ref 3.5–5.0)
ALK PHOS: 76 U/L (ref 38–126)
ALT: 11 U/L — AB (ref 17–63)
AST: 19 U/L (ref 15–41)
Bilirubin, Direct: 0.2 mg/dL (ref 0.1–0.5)
Indirect Bilirubin: 0.9 mg/dL (ref 0.3–0.9)
TOTAL PROTEIN: 4.5 g/dL — AB (ref 6.5–8.1)
Total Bilirubin: 1.1 mg/dL (ref 0.3–1.2)

## 2015-08-18 LAB — RENAL FUNCTION PANEL
ALBUMIN: 1.8 g/dL — AB (ref 3.5–5.0)
Anion gap: 8 (ref 5–15)
BUN: 17 mg/dL (ref 6–20)
CO2: 25 mmol/L (ref 22–32)
CREATININE: 0.93 mg/dL (ref 0.61–1.24)
Calcium: 7.5 mg/dL — ABNORMAL LOW (ref 8.9–10.3)
Chloride: 107 mmol/L (ref 101–111)
GFR calc Af Amer: >60 mL/min (ref >60)
Glucose, Bld: 85 mg/dL (ref 65–99)
POTASSIUM: 4.1 mmol/L (ref 3.5–5.1)
Phosphorus: 2.7 mg/dL (ref 2.5–4.6)
SODIUM: 140 mmol/L (ref 135–145)

## 2015-08-18 LAB — PROTIME-INR
INR: 1.18
INR: 1.21
Prothrombin Time: 15.1 seconds (ref 11.4–15.2)
Prothrombin Time: 15.4 seconds — ABNORMAL HIGH (ref 11.4–15.2)

## 2015-08-18 LAB — MAGNESIUM: MAGNESIUM: 1.4 mg/dL — AB (ref 1.7–2.4)

## 2015-08-18 LAB — BASIC METABOLIC PANEL
ANION GAP: 9 (ref 5–15)
BUN: 17 mg/dL (ref 6–20)
CALCIUM: 6.3 mg/dL — AB (ref 8.9–10.3)
CO2: 23 mmol/L (ref 22–32)
CREATININE: 0.85 mg/dL (ref 0.61–1.24)
Chloride: 107 mmol/L (ref 101–111)
GFR calc Af Amer: >60 mL/min (ref >60)
GLUCOSE: 81 mg/dL (ref 65–99)
Potassium: 4.1 mmol/L (ref 3.5–5.1)
Sodium: 139 mmol/L (ref 135–145)

## 2015-08-18 LAB — LIPASE, BLOOD: LIPASE: 61 U/L — AB (ref 11–51)

## 2015-08-18 LAB — CK: CK TOTAL: 59 U/L (ref 49–397)

## 2015-08-18 LAB — LACTIC ACID, PLASMA: Lactic Acid, Venous: 0.8 mmol/L (ref 0.5–1.9)

## 2015-08-18 MED ORDER — CALCIUM GLUCONATE 10 % IV SOLN
1.0000 g | Freq: Once | INTRAVENOUS | Status: AC
  Administered 2015-08-18: 1 g via INTRAVENOUS
  Filled 2015-08-18: qty 10

## 2015-08-18 MED ORDER — MAGNESIUM SULFATE 4 GM/100ML IV SOLN
4.0000 g | Freq: Once | INTRAVENOUS | Status: AC
  Administered 2015-08-18: 4 g via INTRAVENOUS
  Filled 2015-08-18: qty 100

## 2015-08-18 MED ORDER — BOOST / RESOURCE BREEZE PO LIQD
1.0000 | Freq: Two times a day (BID) | ORAL | Status: DC
  Administered 2015-08-18 – 2015-08-19 (×3): 1 via ORAL

## 2015-08-18 MED ORDER — SODIUM CHLORIDE 0.9% FLUSH
10.0000 mL | INTRAVENOUS | Status: DC | PRN
Start: 2015-08-18 — End: 2015-08-21
  Administered 2015-08-20: 30 mL
  Filled 2015-08-18: qty 40

## 2015-08-18 NOTE — Progress Notes (Deleted)
Pt had massive bloody BM. Pt alert and oriented x4, Vital signs normal. Abdomen non-tender, soft, no complaints of pain. CCM and CCS called and notified.

## 2015-08-18 NOTE — Progress Notes (Signed)
Nutrition Follow-up  DOCUMENTATION CODES:   Not applicable  INTERVENTION:  - Will order Boost Breeze BID, each supplement provides 250 kcal and 9 grams of protein - Diet advancement as medically feasible. - RD will continue to monitor for nutrition-related needs with diet advancement.   NUTRITION DIAGNOSIS:   Inadequate protein intake related to other (see comment) (current diet order) as evidenced by other (see comment) (CLD does not meet estimated protein needs.). -updated.  GOAL:   Patient will meet greater than or equal to 90% of their needs -unmet.  MONITOR:   PO intake, Supplement acceptance, Diet advancement, Weight trends, Labs, Skin, I & O's  ASSESSMENT:   89M initially admitted 08/10/15 with complaints of melena and symptomatic anemia. He underwent EGD and colonoscopy, but no source of bleeding was identified. A CTA was obtained which revealed an internal hernia with incarcerated small bowel and likely bowel ischemia. He was taken emergently to the OR for exploratory laparotomy. A fully necrotic portion of small bowel was resected and the abdomen was left open for a second look in 36h to see if another portion of impaired bowel will recover or need to be resected. His PMH is significant for prior Roux-[facility]. Since admission he has received 6 units pRBCs.   8/8 Pt extubated following surgery and estimated nutrition needs updated based on this event. Pt with ischemic bowel s/p ex lap and small bowel resection. Per chart review, weight trending up since admission; admission weight of 79.4 kg used to calculate estimated nutrition needs.  Diet advanced to CLD this AM; will order Boost Breeze to supplement. Pt reports that abdominal pain began on the day of admission with no abdominal pain or nausea prior to that date. Pt denies any changes to appetite or PO intakes PTA and denies any recent weight fluctuations; no weight hx available PTA.   RD will  continue to monitor for nutrition-related needs with further diet advancement. NGT out overnight with no plan for replacement. Pt denies nausea or abdominal discomfort without NGT. RN notes indicate 2 bloody BMs overnight.    Medications reviewed; 1 g IV Ca cluconate x1 dose today, 4 g IV Mg sulfate x1 dose today, 40 mg IV Protonix BID.  Labs reviewed; Ca: 7.5 mg/dL, Mg: 1.4 mg/dL. IVF: LR @ 125 mL/hr.    8/6 - Pt is s/p ex lap and wound closure 8/6.  - Pt to remain intubated today for any additional procedures that may be needed.  - No family present at time of visit.  - Nutrition focused physical exam shows no sign of depletion of muscle mass or body fat. - Weaned off pressors.   Patient is currently intubated on ventilator support MV: 9.8 L/min Temp (24hrs), Avg:98.7 F (37.1 C),Max:99 F (37.2 C) Propofol: 10.1  Ml/hr - provides 267 fat kcal    Diet Order:  Diet clear liquid Room service appropriate? Yes; Fluid consistency: Thin  Skin:  Wound (see comment) (8/4 & 8/6 abdominal incisions, facial lacerations)  Last BM:  8/4  Height:   Ht Readings from Last 1 Encounters:  08/16/15 5\' 7"  (1.702 m)    Weight:   Wt Readings from Last 1 Encounters:  08/16/15 185 lb 3 oz (84 kg)    Ideal Body Weight:  67.2 kg  BMI:  Body mass index is 29 kg/m.  Estimated Nutritional Needs:   Kcal:  1900-2100  Protein:  90-100 grams  Fluid:  2 L/day  EDUCATION NEEDS:  No education needs identified at this time    Jarome Matin, MS, RD, LDN Inpatient Clinical Dietitian Pager # [phone] After hours/weekend pager # [phone]

## 2015-08-18 NOTE — Progress Notes (Signed)
Transferred TO ROOM 1529 VIA WHEELCHAIR.

## 2015-08-18 NOTE — Progress Notes (Signed)
RN called [facility] for critical lab value. Calcium is 6.3.   Around this time pt called out for help, pt had had a second bloody BM: loose, thick/ mucous- like, dark red.  MD made aware pt had second BM, pt still alert, oriented x4 , blood pressures stable, HR in the 90's, non- tender abdomen. MD advised to update CCS if third BM occurred, pt maintains stable. Will monitor closely.

## 2015-08-18 NOTE — Progress Notes (Signed)
eLink Physician-Brief Progress Note Patient Name: Margaretha SeedsJames Perea DOB: 10/17/1970 MRN: [id number]  RN called for lab review after 1st bloody BM and reported she updated CCS and has been advised to continue monitoring. As she was talking to me 2nd bloody BM + per RN. Vitals stable per RN  LABS reviewd  PULMONARY  Recent Labs Lab 08/14/15 2350  PHART 7.326*  PCO2ART 42.6  PO2ART 209*  HCO3 21.6  TCO2 20.8  O2SAT 99.5    CBC  Recent Labs Lab 08/14/15 0333  08/15/15 0555  08/15/15 1500 08/16/15 0500 08/17/15 2334  HGB 8.1*  < > 7.3*  < > 7.7* 8.2* 8.2*  HCT 24.5*  < > 22.0*  < > 23.5* 24.7* 24.9*  WBC 10.2  --  8.8  --   --   --  11.8*  PLT 263  --  288  --   --   --  277  < > = values in this interval not displayed.  COAGULATION  Recent Labs Lab 08/15/15 1500 08/17/15 2334  INR 1.64 1.18    CARDIAC   Recent Labs Lab 08/15/15 1500 08/15/15 2138 08/16/15 0300  TROPONINI <0.03 <0.03 <0.03   No results for input(s): PROBNP in the last 168 hours.   CHEMISTRY  Recent Labs Lab 08/13/15 0353 08/13/15 1634 08/14/15 0333 08/15/15 0555 08/16/15 0500 08/17/15 2334  NA 138 142 138 138 135 139  K 3.6 4.2 3.8 3.9 4.1 4.1  CL 112*  --  107 109 107 107  CO2 23  --  23 22 24 23   GLUCOSE 85 93 79 167* 91 81  BUN 36*  --  21* 29* 26* 17  CREATININE 0.94  --  0.94 1.31* 1.04 0.85  CALCIUM 7.9*  --  8.1* 7.0* 7.3* 6.3*  MG  --   --   --   --  1.9  --   PHOS  --   --   --   --  3.7  --    Estimated Creatinine Clearance: 113.8 mL/min (by C-G formula based on SCr of 0.85 mg/dL).   LIVER  Recent Labs Lab 08/11/15 0743 08/15/15 1500 08/16/15 0500 08/17/15 2334  AST 16  --  24 19  ALT 13*  --  11* 11*  ALKPHOS 38  --  39 76  BILITOT 1.1  --  1.4* 1.1  PROT 5.0*  --  4.4* 4.5*  ALBUMIN 3.2*  --  2.1*  2.1* 1.9*  INR  --  1.64  --  1.18     INFECTIOUS  Recent Labs Lab 08/15/15 1500 08/16/15 0500 08/17/15 2359  LATICACIDVEN  --   --  0.8   PROCALCITON 20.97 16.95  --      ENDOCRINE CBG (last 3)   Recent Labs  08/15/15 2355 08/16/15 0620 08/16/15 0807  GLUCAP 120* 94 100*     CK - 59 Lactate 0.8    IMAGING x48h  - image(s) personally visualized  -   highlighted in bold No results found.    A) Anemia - stable B) hypocalcemia + - will repleted C) other labs  - ok ; mnitori   Intervention Category Intermediate Interventions: Bleeding - evaluation and treatment with blood products;Diagnostic test evaluation  Prayan Ulin 08/18/2015, 1:41 AM

## 2015-08-18 NOTE — Progress Notes (Signed)
Patient transferred to 5W room 1529.  Patient is alert and oriented, denies pain.  Ambulated with one assist to bed from wheelchair.  Patient oriented to unit, room, staff, call bell, and telephone.  Patient assessed and vitals obtained.  No questions or concerns at this time.  Will continue to monitor.

## 2015-08-18 NOTE — Progress Notes (Addendum)
2 Days Post-Op  Subjective: He looks very good.  He lost his NG last PM, he also had a bloody BM x 2 reported.  No BS noted on exam this AM.  Site looks great, waffle dressing in place.    Objective: Vital signs in last 24 hours: Temp:  [99.2 F (37.3 C)-100.3 F (37.9 C)] 99.4 F (37.4 C) (08/08 0403) Pulse Rate:  [28-95] 86 (08/08 0600) Resp:  [0-23] 20 (08/08 0600) BP: (135-151)/(86-108) 138/90 (08/08 0600) SpO2:  [86 %-100 %] 92 % (08/08 0000) Last BM Date: 08/14/15 NPO 3553 IV 1385 urine NO BM Afebrile, VSS Labs OK NO films Intake/Output from previous day: 08/07 0701 - 08/08 0700 In: 3553.2 [I.V.:3403.2; IV Piggyback:150] Out: 1385 [Urine:1385] Intake/Output this shift: No intake/output data recorded.  General appearance: alert, cooperative and no distress Resp: clear to auscultation bilaterally GI: soft, no BS, he did have a bloody BM x 2 last PM.  No distension NG came outl last PM.  Lab Results:   Recent Labs  08/17/15 2334 08/18/15 0500  WBC 11.8* 11.0*  HGB 8.2* 8.0*  HCT 24.9* 24.2*  PLT 277 316    BMET  Recent Labs  08/17/15 2334 08/18/15 0500  NA 139 140  K 4.1 4.1  CL 107 107  CO2 23 25  GLUCOSE 81 85  BUN 17 17  CREATININE 0.85 0.93  CALCIUM 6.3* 7.5*   PT/INR  Recent Labs  08/17/15 2334 08/18/15 0500  LABPROT 15.1 15.4*  INR 1.18 1.21     Recent Labs Lab 08/11/15 0743 08/16/15 0500 08/17/15 2334 08/18/15 0500  AST 16 24 19   --   ALT 13* 11* 11*  --   ALKPHOS 38 39 76  --   BILITOT 1.1 1.4* 1.1  --   PROT 5.0* 4.4* 4.5*  --   ALBUMIN 3.2* 2.1*  2.1* 1.9* 1.8*     Lipase     Component Value Date/Time   LIPASE 61 (H) 08/17/2015 2334     Studies/Results: No results found. Prior to Admission medications   Medication Sig Start Date End Date Taking? Authorizing Provider  ferrous sulfate 325 (65 FE) MG EC tablet Take 325 mg by mouth daily with breakfast.   Yes Historical Provider, MD  Multiple Vitamins-Minerals  (MULTIVITAMIN ADULT PO) Take 1 tablet by mouth daily.   Yes Historical Provider, MD  Omega-3 Fatty Acids (FISH OIL) 1000 MG CAPS Take 1 capsule by mouth daily.   Yes Historical Provider, MD  vitamin B-12 (CYANOCOBALAMIN) 500 MCG tablet Take 500 mcg by mouth daily.   Yes Historical Provider, MD    Medications: . ALPRAZolam  0.25 mg Oral Once  . antiseptic oral rinse  7 mL Mouth Rinse BID  . enoxaparin (LOVENOX) injection  40 mg Subcutaneous Q24H  . magnesium sulfate 1 - 4 g bolus IVPB  4 g Intravenous Once  . pantoprazole  40 mg Intravenous Q12H  . piperacillin-tazobactam  3.375 g Intravenous Q8H   . sodium chloride 10 mL/hr at 08/16/15 1341  . 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  Laparoscopy, laparotomy with reduction of internal hernia, ileocecectomy with anastomosis and placement of abdominal VAC dressing - 08/14/2015 - [facility]  POD 4 2.  EXPLORATORY LAPAROTOMY WOUND EXPLORATION WOUND CLOSURE - 08/16/2015 - [facility]  POD 2  NG came out yesterday, this was on Roux side, it was not replaced.  Anemia - stable FEN:  NPO/IV fluids - ice chips ID: day 5 Zosyn DVT: Lovenox  Plan:  I will check on transfer to floor, continue to mobilize and see how he does.    LOS: 7 days    JENNINGS,WILLARD 08/18/2015 [phone]  Agree with above. He continues to look very good.  He had a couple of small bloody BMs. Will start clear liquids. Okay to transfer out of ICU.  Ovidio Kin, MD, [facility] Pager: [phone] Office phone:  [phone]

## 2015-08-18 NOTE — Progress Notes (Signed)
Pt had massive bloody BM. Pt alert and oriented x4, Vital signs stable. Abdomen non-tender, soft, no complaints of pain. CCM and CCS called and notified.   CCM ordered lab work up and asked RN to call CCS.  RN called CCS, updated about BM and CCM's orders for labs, MD made aware pt was stable, alert, oriented. BP holding.  Will continue to monitor closely.

## 2015-08-18 NOTE — Progress Notes (Signed)
Patient ID: Walter Shields, male   DOB: 05/12/1970, 45 y.o.   MRN: [id number]   eLink Physician Progress Note and Electrolyte Replacement  Patient Name: Walter Shields DOB: 10/20/1970 MRN: [id number]  Date of Service  08/18/2015   HPI/Events of Note    Recent Labs Lab 08/14/15 0333 08/15/15 0555 08/16/15 0500 08/17/15 2334 08/18/15 0500  NA 138 138 135 139 140  K 3.8 3.9 4.1 4.1 4.1  CL 107 109 107 107 107  CO2 23 22 24 23 25   GLUCOSE 79 167* 91 81 85  BUN 21* 29* 26* 17 17  CREATININE 0.94 1.31* 1.04 0.85 0.93  CALCIUM 8.1* 7.0* 7.3* 6.3* 7.5*  MG  --   --  1.9  --  1.4*  PHOS  --   --  3.7  --  2.7    Estimated Creatinine Clearance: 104 mL/min (by C-G formula based on SCr of 0.93 mg/dL).  Intake/Output      08/07 0701 - 08/08 0700   Urine (mL/kg/hr) 1385 (0.7)   Total Output 1385   Net -1385        - I/O DETAILED x 24h    Total I/O In: -  Out: 875 [Urine:875] - I/O THIS SHIFT    ASSESSMENT Low mag  eICURN Interventions  Mag sulfate 4gm   ASSESSMENT: MAJOR ELECTROLYTE      Dr. Kalman ShanMurali Ramya Vanbergen, M.D., [facility]P Pulmonary and Critical Care Medicine Staff Physician [facility] Pager: [phone], If no answer or between  15:00h - 7:00h: call [phone]  08/18/2015 6:25 AM

## 2015-08-18 NOTE — Progress Notes (Signed)
Patient ID: Walter Shields, male   DOB: 05/17/1970, 45 y.o.   MRN: [id number]    PROGRESS NOTE    Walter Shields  [id number] DOB: 03/20/1970 DOA: 08/10/2015  PCP: No primary care provider on file.   Brief Narrative:  45 y.o. male with known gastric bypass in 2012 and bleeding peptic ulcer approximately 2 years ago that required transfusion, now presented to the ED following a syncopal episode after his third bloody bowel movement of the day. Patient is from out of town, here for business and was in his usual state of health.   Developed abd pain 8.4 and underwent urgent ex lap, was intubated post op and [facility] assumed care from 08/18/2015.   Assessment & Plan:   1. Acute blood loss anemia/ acute GI bleed with symptomatic anemia  - Initial Hgb 7.3 with MCV 73.5; platelets and INR WNL - EGD 08/11/2015 did not show any blood in lumen of upper tract but ? visible vessel noted at surgical anastomosis, was ablated with APC. - No ulcer was appreciated - has received total 7 U PRBC since admission  - Bleeding scan negative - Appreciate GI team recommendations and assistance - No active bleeding overnight  2. Right sided abd pain 08/14/2015 - CT scan with clear evidence of bowel obstruction and very likely internal hernia. Possible ischemia. - pt is now s/p exploratory laparotomy with reduction of internal hernia, ileocecectomy with anastomosis an dplacement of abd VAC dressing 8.4 by Dr Johna SheriffH[facility]WOR[facility], post op day #4 - s/p ex lap wound exploration, wound closure 08/15/3025 Dr. Johna SheriffHoxworth post op day #2 - surgery team following   2. Syncope  - pt reports feeling better but has not gotten out of the bed yet - will need PT eval  3. Hx of bleeding PUD  - Pt denies use of NSAIDs or alcohol  - Currently on IV PPI  - no further episodes of bleeding  - GI available as needed  - CBC in AM  4. Hx of gastric bypass  - Performed in 2012   5. Acute kidney injury, hypomag - from  GI bleed - Mg low, supplement and repeat in AM - Creatinine now within normal limits - BMP in AM  DVT prophylaxis: SCD's Code Status: Full Family Communication: Patient at bedside  Disposition Plan: home once cleared by GI  Consultants:   GI  Procedures:   EGD 8/1  Antimicrobials:   None    Subjective: No bloody BM's this AM.   Objective: Vitals:   08/18/15 0400 08/18/15 0403 08/18/15 0500 08/18/15 0600  BP: (!) 141/98   138/90  Pulse: 86  86 86  Resp: 19  20 20   Temp:  99.4 F (37.4 C)    TempSrc:  Oral    SpO2:      Weight:      Height:        Intake/Output Summary (Last 24 hours) at 08/18/15 0710 Last data filed at 08/18/15 0600  Gross per 24 hour  Intake          3553.17 ml  Output             1385 ml  Net          2168.17 ml   Filed Weights   08/14/15 1335 08/15/15 0500 08/16/15 0500  Weight: 76.2 kg (168 lb) 80.8 kg (178 lb 2.1 oz) 84 kg (185 lb 3 oz)    Examination:  General exam: Appears calm and  comfortable  Respiratory system: Clear to auscultation. Respiratory effort normal. Cardiovascular system: S1 & S2 heard, RRR. No JVD,  rubs, gallops or clicks. No pedal edema. Gastrointestinal system: Abdomen is nondistended, soft and nontender. Central nervous system: Alert and oriented. No focal neurological deficits. Extremities: Symmetric 5 x 5 power. Psychiatry: Judgement and insight appear normal. Mood & affect appropriate.    Data Reviewed: I have personally reviewed following labs and imaging studies  CBC:  Recent Labs Lab 08/13/15 0353  08/14/15 0333  08/15/15 0555 08/15/15 0915 08/15/15 1500 08/16/15 0500 08/17/15 2334 08/18/15 0500  WBC 6.7  --  10.2  --  8.8  --   --   --  11.8* 11.0*  NEUTROABS  --   --   --   --   --   --   --   --  9.6* 8.6*  HGB 6.1*  < > 8.1*  < > 7.3* 6.9* 7.7* 8.2* 8.2* 8.0*  HCT 18.6*  < > 24.5*  < > 22.0* 21.0* 23.5* 24.7* 24.9* 24.2*  MCV 81.6  --  82.5  --  83.7  --   --   --  81.6 80.7  PLT 212   --  263  --  288  --   --   --  277 316  < > = values in this interval not displayed. Basic Metabolic Panel:  Recent Labs Lab 08/14/15 0333 08/15/15 0555 08/16/15 0500 08/17/15 2334 08/18/15 0500  NA 138 138 135 139 140  K 3.8 3.9 4.1 4.1 4.1  CL 107 109 107 107 107  CO2 GLUCOSE 79 167* 91 81 85  BUN 21* 29* 26* 17 17  CREATININE 0.94 1.31* 1.04 0.85 0.93  CALCIUM 8.1* 7.0* 7.3* 6.3* 7.5*  MG  --   --  1.9  --  1.4*  PHOS  --   --  3.7  --  2.7   Liver Function Tests:  Recent Labs Lab 08/11/15 0743 08/16/15 0500 08/17/15 2334 08/18/15 0500  AST --   ALT 13* 11* 11*  --   ALKPHOS 38 39 76  --   BILITOT 1.1 1.4* 1.1  --   PROT 5.0* 4.4* 4.5*  --   ALBUMIN 3.2* 2.1*  2.1* 1.9* 1.8*   Coagulation Profile:  Recent Labs Lab 08/15/15 1500 08/17/15 2334 08/18/15 0500  INR 1.64 1.18 1.21   CBG:  Recent Labs Lab 08/15/15 1555 08/15/15 2045 08/15/15 2355 08/16/15 0620 08/16/15 0807  GLUCAP 144* 121* 120* 94 100*   Anemia Panel: No results for input(s): VITAMINB12, FOLATE, FERRITIN, TIBC, IRON, RETICCTPCT in the last 72 hours. Urine analysis:    Component Value Date/Time   COLORURINE YELLOW 08/10/2015 2038   APPEARANCEUR CLEAR 08/10/2015 2038   LABSPEC 1.025 08/10/2015 2038   PHURINE 6.5 08/10/2015 2038   GLUCOSEU NEGATIVE 08/10/2015 2038   HGBUR NEGATIVE 08/10/2015 2038   BILIRUBINUR SMALL (A) 08/10/2015 2038   KETONESUR NEGATIVE 08/10/2015 2038   PROTEINUR NEGATIVE 08/10/2015 2038   NITRITE NEGATIVE 08/10/2015 2038   LEUKOCYTESUR TRACE (A) 08/10/2015 2038   Recent Results (from the past 240 hour(s))  MRSA PCR Screening     Status: None   Collection Time: 08/11/15  1:31 AM  Result Value Ref Range Status   MRSA by PCR NEGATIVE NEGATIVE Final     Radiology Studies: No results found.  Scheduled Meds: . ALPRAZolam  0.25 mg Oral Once  .  antiseptic oral rinse  7 mL Mouth Rinse BID  . enoxaparin (LOVENOX) injection  40 mg  Subcutaneous Q24H  . magnesium sulfate 1 - 4 g bolus IVPB  4 g Intravenous Once  . pantoprazole  40 mg Intravenous Q12H  . piperacillin-tazobactam  3.375 g Intravenous Q8H   Continuous Infusions: . sodium chloride 10 mL/hr at 08/16/15 1341  . lactated ringers 125 mL/hr at 08/17/15 1613     LOS: 7 days    Time spent: 20 minutes   Debbora Presto, MD [facility] Pager [phone]  If 7PM-7AM, please contact night-coverage [email or web address] Password [email or web address] 08/18/2015, 7:10 AM

## 2015-08-19 DIAGNOSIS — E876 Hypokalemia: Secondary | ICD-10-CM

## 2015-08-19 LAB — MAGNESIUM: Magnesium: 1.9 mg/dL (ref 1.7–2.4)

## 2015-08-19 LAB — BASIC METABOLIC PANEL
ANION GAP: 4 — AB (ref 5–15)
BUN: 11 mg/dL (ref 6–20)
CHLORIDE: 103 mmol/L (ref 101–111)
CO2: 27 mmol/L (ref 22–32)
Calcium: 7.1 mg/dL — ABNORMAL LOW (ref 8.9–10.3)
Creatinine, Ser: 0.67 mg/dL (ref 0.61–1.24)
GFR calc non Af Amer: >60 mL/min (ref >60)
GLUCOSE: 100 mg/dL — AB (ref 65–99)
Potassium: 2.7 mmol/L — CL (ref 3.5–5.1)
Sodium: 134 mmol/L — ABNORMAL LOW (ref 135–145)

## 2015-08-19 LAB — CBC
HEMATOCRIT: 24.7 % — AB (ref 39.0–52.0)
HEMOGLOBIN: 8.2 g/dL — AB (ref 13.0–17.0)
MCH: 26.9 pg (ref 26.0–34.0)
MCHC: 33.2 g/dL (ref 30.0–36.0)
MCV: 81 fL (ref 78.0–100.0)
Platelets: 344 10*3/uL (ref 150–400)
RBC: 3.05 MIL/uL — ABNORMAL LOW (ref 4.22–5.81)
RDW: 18.5 % — AB (ref 11.5–15.5)
WBC: 6.9 10*3/uL (ref 4.0–10.5)

## 2015-08-19 LAB — PHOSPHORUS: PHOSPHORUS: 2 mg/dL — AB (ref 2.5–4.6)

## 2015-08-19 MED ORDER — POTASSIUM CHLORIDE CRYS ER 20 MEQ PO TBCR
40.0000 meq | EXTENDED_RELEASE_TABLET | Freq: Once | ORAL | Status: AC
  Administered 2015-08-19: 40 meq via ORAL
  Filled 2015-08-19: qty 2

## 2015-08-19 MED ORDER — MAGNESIUM SULFATE IN D5W 1-5 GM/100ML-% IV SOLN
1.0000 g | Freq: Once | INTRAVENOUS | Status: AC
  Administered 2015-08-19: 1 g via INTRAVENOUS
  Filled 2015-08-19: qty 100

## 2015-08-19 MED ORDER — SACCHAROMYCES BOULARDII 250 MG PO CAPS
250.0000 mg | ORAL_CAPSULE | Freq: Two times a day (BID) | ORAL | Status: DC
  Administered 2015-08-19 – 2015-08-21 (×5): 250 mg via ORAL
  Filled 2015-08-19 (×5): qty 1

## 2015-08-19 MED ORDER — ACETAMINOPHEN 325 MG PO TABS
650.0000 mg | ORAL_TABLET | Freq: Four times a day (QID) | ORAL | Status: DC | PRN
  Administered 2015-08-19: 650 mg via ORAL
  Filled 2015-08-19: qty 2

## 2015-08-19 MED ORDER — POTASSIUM CHLORIDE CRYS ER 20 MEQ PO TBCR
30.0000 meq | EXTENDED_RELEASE_TABLET | ORAL | Status: AC
  Administered 2015-08-19 (×2): 30 meq via ORAL
  Filled 2015-08-19 (×2): qty 1

## 2015-08-19 NOTE — Progress Notes (Signed)
CRITICAL VALUE ALERT Potassium 2.7   Critical value received:    Date of notification: 08/19/2015  Time of notification: 0620  Critical value read back: yes  Nurse who received alert: Marcia BrashSophia Wright  MD notified (1st page): ( MID level)  K.Kirby Time of first page:  [phone]  MD notified (2nd page):  Time of second page:  Responding MD:    Time MD responded:

## 2015-08-19 NOTE — Progress Notes (Signed)
PROGRESS NOTE                                                                                                                                                                                                             Patient Demographics:    Walter Shields, is a 45 y.o. male, DOB - June 20, 1970, GNF:[id number]  Admit date - 08/10/2015   Admitting Physician Dorothea Ogle, MD  Outpatient Primary MD for the patient is No primary care provider on file.  LOS - 8  Outpatient Specialists: None  Chief Complaint  Patient presents with  . Loss of Consciousness       Brief Narrative   45 y.o.malewith known gastric bypass in 2012 and bleeding peptic ulcer approximately 2 years ago that required transfusion, now presented to the ED following a syncopal episode after his third bloody bowel movement of the day. Patient is from out of town, here for business and was in his usual state of health.  Patient developed abdominal pain on 8/4 and underwent urgent ex lap, was intubated post op and care transferred to [facility], [facility] assumed care from 08/18/2015.    Subjective:   Patient reports mild abdominal soreness   Assessment  & Plan :    Principal Problem:   Acute GI bleeding With symptomatic anemia EGD negative for active bleeding or ulcer but ?visible vessel noted at surgical anastomosis which was ablated with APC. Has received a total 17 unit PRBC. Negative bleeding scan. Appreciate GI recommendations.  Right-sided abdominal pain CT abdomen showed bowel obstruction with likely internal hernia and ossicle ischemia. Underwent exploratory laparotomy with reduction of internal hernia, ileocecectomy with anastomosis and placement of abdominal VAC. (Postop day 5) Patient then underwent a day laparotomy with wound excoriation and wound closure on 8/6. Stable postop. Advance diet to full liquid.  Patient mobilizing well. Will complete 7  days of antibiotics tomorrow.  Active Problems: History of gastric bypass  Acute kidney injury Secondary to dehydration and GI bleed Now resolved with IV fluids.  Hypokalemia/hypomagnesemia Replenish        Code Status : Full code  Family Communication  : Brother bedside  Disposition Plan  : Home possibly on 8/11  Barriers For Discharge : Improving symptoms  Consults  :   [place] [facility] CM  Procedures  : EGD on 8/1  Expiratory laparotomy on 8/5 and 8/7  DVT Prophylaxis  :  Lovenox -   Lab Results  Component Value Date   PLT 344 08/19/2015    Antibiotics  :    Anti-infectives    Start     Dose/Rate Route Frequency Ordered Stop   08/16/15 1400  piperacillin-tazobactam (ZOSYN) IVPB 3.375 g     3.375 g 12.5 mL/hr over 240 Minutes Intravenous Every 8 hours 08/16/15 1242     08/15/15 0400  piperacillin-tazobactam (ZOSYN) IVPB 3.375 g  Status:  Discontinued     3.375 g 12.5 mL/hr over 240 Minutes Intravenous Every 8 hours 08/14/15 2247 08/16/15 1101   08/14/15 1900  piperacillin-tazobactam (ZOSYN) IVPB 3.375 g  Status:  Discontinued     3.375 g 12.5 mL/hr over 240 Minutes Intravenous NOW 08/14/15 1847 08/14/15 1857   08/14/15 1900  piperacillin-tazobactam (ZOSYN) IVPB 3.375 g     3.375 g 100 mL/hr over 30 Minutes Intravenous  Once 08/14/15 1857 08/14/15 1930        Objective:   Vitals:   08/18/15 1342 08/18/15 2115 08/19/15 0626 08/19/15 1350  BP: (!) 141/99 (!) 149/94 119/83 (!) 143/91  Pulse: 84 75 74 80  Resp: 20 18 16 16   Temp: 99.3 F (37.4 C) 99.3 F (37.4 C) 98.8 F (37.1 C) 98.5 F (36.9 C)  TempSrc: Oral Oral Oral Oral  SpO2: 100% 100% 99% 100%  Weight:      Height:        Wt Readings from Last 3 Encounters:  08/16/15 84 kg (185 lb 3 oz)     Intake/Output Summary (Last 24 hours) at 08/19/15 1604 Last data filed at 08/19/15 0626  Gross per 24 hour  Intake              431 ml  Output                0 ml  Net              431  ml     Physical Exam  Gen: not in distress HEENT: Pallor present, moist mucosa, supple neck Chest: clear b/l, no added sounds CVS: N S1&S2, no murmurs, rubs or gallop GI: The laparotomy scar, nondistended, minimal tenderness, bowel sounds present Musculoskeletal: warm, no edema CNS: Alert and oriented    Data Review:    CBC  Recent Labs Lab 08/14/15 0333  08/15/15 0555  08/15/15 1500 08/16/15 0500 08/17/15 2334 08/18/15 0500 08/19/15 0315  WBC 10.2  --  8.8  --   --   --  11.8* 11.0* 6.9  HGB 8.1*  < > 7.3*  < > 7.7* 8.2* 8.2* 8.0* 8.2*  HCT 24.5*  < > 22.0*  < > 23.5* 24.7* 24.9* 24.2* 24.7*  PLT 263  --  288  --   --   --  277 316 344  MCV 82.5  --  83.7  --   --   --  81.6 80.7 81.0  MCH 27.3  --  27.8  --   --   --  26.9 26.7 26.9  MCHC 33.1  --  33.2  --   --   --  32.9 33.1 33.2  RDW 17.8*  --  19.8*  --   --   --  18.6* 18.5* 18.5*  LYMPHSABS  --   --   --   --   --   --  0.8 0.9  --   MONOABS  --   --   --   --   --   --  1.2* 1.4*  --   EOSABS  --   --   --   --   --   --  0.1 0.1  --   BASOSABS  --   --   --   --   --   --  0.0 0.0  --   < > = values in this interval not displayed.  Chemistries   Recent Labs Lab 08/15/15 0555 08/16/15 0500 08/17/15 2334 08/18/15 0500 08/19/15 0315  NA 138 135 139 140 134*  K 3.9 4.1 4.1 4.1 2.7*  CL 109 107 107 107 103  CO2 22 24 23 25 27   GLUCOSE 167* 91 81 85 100*  BUN 29* 26* 17 17 11   CREATININE 1.31* 1.04 0.85 0.93 0.67  CALCIUM 7.0* 7.3* 6.3* 7.5* 7.1*  MG  --  1.9  --  1.4* 1.9  AST  --  24 19  --   --   ALT  --  11* 11*  --   --   ALKPHOS  --  39 76  --   --   BILITOT  --  1.4* 1.1  --   --    ------------------------------------------------------------------------------------------------------------------ No results for input(s): CHOL, HDL, LDLCALC, TRIG, CHOLHDL, LDLDIRECT in the last 72 hours.  Lab Results  Component Value Date   HGBA1C 5.2 08/15/2015    ------------------------------------------------------------------------------------------------------------------ No results for input(s): TSH, T4TOTAL, T3FREE, THYROIDAB in the last 72 hours.  Invalid input(s): FREET3 ------------------------------------------------------------------------------------------------------------------ No results for input(s): VITAMINB12, FOLATE, FERRITIN, TIBC, IRON, RETICCTPCT in the last 72 hours.  Coagulation profile  Recent Labs Lab 08/15/15 1500 08/17/15 2334 08/18/15 0500  INR 1.64 1.18 1.21    No results for input(s): DDIMER in the last 72 hours.  Cardiac Enzymes  Recent Labs Lab 08/15/15 1500 08/15/15 2138 08/16/15 0300  TROPONINI <0.03 <0.03 <0.03   ------------------------------------------------------------------------------------------------------------------ No results found for: BNP  Inpatient Medications  Scheduled Meds: . ALPRAZolam  0.25 mg Oral Once  . antiseptic oral rinse  7 mL Mouth Rinse BID  . enoxaparin (LOVENOX) injection  40 mg Subcutaneous Q24H  . feeding supplement  1 Container Oral BID BM  . pantoprazole  40 mg Intravenous Q12H  . piperacillin-tazobactam  3.375 g Intravenous Q8H  . potassium chloride  40 mEq Oral Once  . saccharomyces boulardii  250 mg Oral BID   Continuous Infusions: . sodium chloride 10 mL/hr at 08/16/15 1341  . lactated ringers 50 mL/hr at 08/19/15 1036   PRN Meds:.acetaminophen, fentaNYL (SUBLIMAZE) injection, sodium chloride flush  Micro Results Recent Results (from the past 240 hour(s))  MRSA PCR Screening     Status: None   Collection Time: 08/11/15  1:31 AM  Result Value Ref Range Status   MRSA by PCR NEGATIVE NEGATIVE Final    Comment:        The GeneXpert MRSA Assay (FDA approved for NASAL specimens only), is one component of a comprehensive MRSA colonization surveillance program. It is not intended to diagnose MRSA infection nor to guide or monitor treatment  for MRSA infections.     Radiology Reports Nm Gi Blood Loss  Result Date: 08/12/2015 CLINICAL DATA:  Bloody stools since 08/10/2015, anemia, hemoglobin = 6.7 g/dL EXAM: NUCLEAR MEDICINE GASTROINTESTINAL BLEEDING SCAN TECHNIQUE: Sequential abdominal images were obtained following intravenous administration of Tc-50m labeled red blood cells. RADIOPHARMACEUTICALS:  24.1 mCi Tc-47m in-vitro labeled autologous red cells IV. COMPARISON:  None FINDINGS: Imaging performed for 2 hours. Significant patient motion artifacts throughout exam. Normal blood  pool distribution of labeled erythrocytes. Small amount of excreted de labeled technetium into urinary bladder. No abnormal gastrointestinal localization of labeled erythrocytes identified to suggest active GI bleeding. IMPRESSION: Negative GI bleeding study. Electronically Signed   By: Ulyses Southward M.D.   On: 08/12/2015 16:45   Dg Chest Port 1 View  Result Date: 08/14/2015 CLINICAL DATA:  45 year old male with endotracheal tube placement. EXAM: PORTABLE CHEST 1 VIEW COMPARISON:  None. FINDINGS: Endotracheal tube the tip approximately 4 cm above the carina. An enteric tube courses into the epigastric area with tip likely in the proximal stomach and the side-port in the distal esophagus. Recommend advancement of the tube into the stomach. Patchy airspace opacity predominantly involving the left mid to lower lung field most compatible with pneumonia. Right mid lung field streaky and nodular densities also concerning for developing infiltrate. There is no pleural effusion or pneumothorax. The cardiac silhouette is within normal limits. No acute osseous pathology. IMPRESSION: Endotracheal tube above the carina. Bilateral pulmonary airspace opacities predominantly involving the left mid to lower lung field most compatible with pneumonia. Clinical correlation and follow-up resolution recommended. Electronically Signed   By: Elgie Collard M.D.   On: 08/14/2015 23:14   Ct  Angio Abdomen Pelvis  W &/or Wo Contrast  Result Date: 08/14/2015 CLINICAL DATA:  Emesis.  GI bleeding. EXAM: CTA ABDOMEN AND PELVIS wITHOUT AND WITH CONTRAST TECHNIQUE: Multidetector CT imaging of the abdomen and pelvis was performed using the standard protocol during bolus administration of intravenous contrast. Multiplanar reconstructed images and MIPs were obtained and reviewed to evaluate the vascular anatomy. CONTRAST:  100 mL of Isovue 370 COMPARISON:  None. FINDINGS: Infiltrate is seen in the lingula, left lower lobe, and medial right lower lobe. There is a small right-sided pleural effusion. The esophagus is dilated with fluid. No other acute abnormalities are identified in the lower chest. No free air. There is free fluid in the abdomen. The patient is status post gastric surgery. There is dilatation of the distal esophagus and gastric remnants. The efferent limb is dilated from the gastric remnant to the anastomosis with afferent limb. There is a twisting of vessels and bowel at the region of the transition point as seen on series 4, image 73. The small bowel is completely decompressed beyond this transition point. A second twisting of mesenteric vessels is seen more proximally as seen on coronal images 70 through 76. There is also a loop of small bowel which extends into the right side of the pelvis which is thick walled extending from axial image 71 into the pelvis such as on axial image 114. There is no pneumatosis in this location. The colon is relatively decompressed and contains fluid consistent with diarrhea. The appendix is not visualized but there is no secondary evidence of appendicitis. The gallbladder is distended. The liver, spleen, adrenal glands, and kidneys are normal. The pancreas is normal as well. The portal vein is normal. The abdominal aorta is non aneurysmal with no dissection. The proximal celiac, superior mesenteric, inferior mesenteric, and renal arteries are normal. There are 2  renal arteries on the right and 1 on the left. No adenopathy or mass in the pelvis. The bladder is normal. Prostate calcifications are noted. Degenerative changes are seen in the spine. Review of the MIP images confirms the above findings. IMPRESSION: 1. Pneumonia in the lingula and lung bases. 2. There is a small bowel obstruction with associated swirling/twisting of the mesenteric vessels in two locations, dilated loops of bowel, and  an abnormal thick walled loop of bowel extending into the pelvis. The findings are highly concerning for an internal hernia resulting in high-grade obstruction and the thickened loop of small bowel is very worrisome for an ischemic loop of small bowel. I discussed the findings with the patient's physician, Dr. Izola Price. Electronically Signed   By: Gerome Sam III M.D   On: 08/14/2015 17:46    Time Spent in minutes  25   Eddie North M.D on 08/19/2015 at 4:04 PM  Between 7am to 7pm - Pager - [phone]  After 7pm go to [email or web address] - password [facility]  [facility] -  Office  [phone]

## 2015-08-19 NOTE — Progress Notes (Addendum)
3 Days Post-Op  Subjective: He looks great, he is up ambulating some. Stools are all very loose. Tolerating clears. He says he has loose stool each time he voids also. Site looks fine.  Objective: Vital signs in last 24 hours: Temp:  [97.4 F (36.3 C)-99.3 F (37.4 C)] 98.8 F (37.1 C) (08/09 0626) Pulse Rate:  [74-93] 74 (08/09 0626) Resp:  [16-22] 16 (08/09 0626) BP: (119-149)/(83-101) 119/83 (08/09 0626) SpO2:  [98 %-100 %] 99 % (08/09 0626) Last BM Date: 08/19/15 300 by mouth recorded Urine 425 recorded  BM 3 yesterday and 1 this a.m. Afebrile vital signs are stable Potassium is 2.7 this a.m. phosphorus is low 2.0, mag is 1.9. CBC is stable with white count trending down.  Intake/Output from previous day: 08/08 0701 - 08/09 0700 In: 1376 [P.O.:300; I.V.:1056] Out: 425 [Urine:425] Intake/Output this shift: No intake/output data recorded.  General appearance: alert, cooperative and no distress Resp: clear to auscultation bilaterally GI: Soft, sore, abdomen is not distended. Waffle dressing is dry and incision looks fine.  Lab Results:   Recent Labs  08/18/15 0500 08/19/15 0315  WBC 11.0* 6.9  HGB 8.0* 8.2*  HCT 24.2* 24.7*  PLT 316 344    BMET  Recent Labs  08/18/15 0500 08/19/15 0315  NA 140 134*  K 4.1 2.7*  CL 107 103  CO2 25 27  GLUCOSE 85 100*  BUN 17 11  CREATININE 0.93 0.67  CALCIUM 7.5* 7.1*   PT/INR  Recent Labs  08/17/15 2334 08/18/15 0500  LABPROT 15.1 15.4*  INR 1.18 1.21     Recent Labs Lab 08/16/15 0500 08/17/15 2334 08/18/15 0500  AST 24 19  --   ALT 11* 11*  --   ALKPHOS 39 76  --   BILITOT 1.4* 1.1  --   PROT 4.4* 4.5*  --   ALBUMIN 2.1*  2.1* 1.9* 1.8*     Lipase     Component Value Date/Time   LIPASE 61 (H) 08/17/2015 2334     Studies/Results: No results found.  Medications: . ALPRAZolam  0.25 mg Oral Once  . antiseptic oral rinse  7 mL Mouth Rinse BID  . enoxaparin (LOVENOX) injection  40 mg  Subcutaneous Q24H  . feeding supplement  1 Container Oral BID BM  . magnesium sulfate 1 - 4 g bolus IVPB  1 g Intravenous Once  . pantoprazole  40 mg Intravenous Q12H  . piperacillin-tazobactam  3.375 g Intravenous Q8H  . potassium chloride  30 mEq Oral Q4H  . potassium chloride  40 mEq Oral Once   . sodium chloride 10 mL/hr at 08/16/15 1341  . 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  Laparoscopy, laparotomy with reduction of internal hernia, ileocecectomy with anastomosis and placement of abdominal VAC dressing- 08/14/2015 - [facility]  POD 5 2.  EXPLORATORY LAPAROTOMY WOUND EXPLORATION WOUND CLOSURE - 08/16/2015 - [facility]  POD 3                       NG came out yesterday, this was on Roux side, it was not replaced. Hypokalemia Anemia - stable FEN:   clear/IV fluids ID: day 6 Zosyn DVT: Lovenox   Plan: Decrease IV fluids, advance to a full liquid diet. Continue to mobilize. Add a probiotic, today  is day 6 of antibiotic use. potassium is being replaced by medicine.  If he continues to do well we should be able to discontinue antibiotics after tomorrow and home on Friday.   LOS: 8 days    JENNINGS,WILLARD 08/19/2015 [phone]  Agree with above. Continues to do well.  Has had several BM's - loose so far.  Ovidio Kinavid Tabatha Razzano, MD, [facility] Pager: [phone] Office phone:  [phone]

## 2015-08-20 DIAGNOSIS — D649 Anemia, unspecified: Secondary | ICD-10-CM

## 2015-08-20 LAB — BASIC METABOLIC PANEL
Anion gap: 5 (ref 5–15)
BUN: 7 mg/dL (ref 6–20)
CALCIUM: 7.3 mg/dL — AB (ref 8.9–10.3)
CO2: 25 mmol/L (ref 22–32)
Chloride: 102 mmol/L (ref 101–111)
Creatinine, Ser: 0.81 mg/dL (ref 0.61–1.24)
GFR calc Af Amer: >60 mL/min (ref >60)
GLUCOSE: 87 mg/dL (ref 65–99)
Potassium: 3.2 mmol/L — ABNORMAL LOW (ref 3.5–5.1)
Sodium: 132 mmol/L — ABNORMAL LOW (ref 135–145)

## 2015-08-20 LAB — PHOSPHORUS: Phosphorus: 2.3 mg/dL — ABNORMAL LOW (ref 2.5–4.6)

## 2015-08-20 LAB — CBC
HEMATOCRIT: 24.8 % — AB (ref 39.0–52.0)
Hemoglobin: 8.1 g/dL — ABNORMAL LOW (ref 13.0–17.0)
MCH: 26 pg (ref 26.0–34.0)
MCHC: 32.7 g/dL (ref 30.0–36.0)
MCV: 79.5 fL (ref 78.0–100.0)
PLATELETS: 411 10*3/uL — AB (ref 150–400)
RBC: 3.12 MIL/uL — ABNORMAL LOW (ref 4.22–5.81)
RDW: 18.1 % — AB (ref 11.5–15.5)
WBC: 4.8 10*3/uL (ref 4.0–10.5)

## 2015-08-20 LAB — MAGNESIUM: Magnesium: 1.6 mg/dL — ABNORMAL LOW (ref 1.7–2.4)

## 2015-08-20 MED ORDER — POTASSIUM CHLORIDE CRYS ER 20 MEQ PO TBCR
40.0000 meq | EXTENDED_RELEASE_TABLET | Freq: Once | ORAL | Status: AC
  Administered 2015-08-20: 40 meq via ORAL
  Filled 2015-08-20: qty 2

## 2015-08-20 MED ORDER — MAGNESIUM SULFATE 2 GM/50ML IV SOLN
2.0000 g | Freq: Once | INTRAVENOUS | Status: AC
  Administered 2015-08-20: 2 g via INTRAVENOUS
  Filled 2015-08-20: qty 50

## 2015-08-20 MED ORDER — POTASSIUM & SODIUM PHOSPHATES 280-160-250 MG PO PACK
1.0000 | PACK | Freq: Three times a day (TID) | ORAL | Status: DC
  Administered 2015-08-20 – 2015-08-21 (×4): 1 via ORAL
  Filled 2015-08-20 (×7): qty 1

## 2015-08-20 NOTE — Progress Notes (Signed)
PROGRESS NOTE                                                                                                                                                                                                             Patient Demographics:    Walter Shields, is a 45 y.o. male, DOB - [birth year], WUJ:[id number]  Admit date - 08/10/2015   Admitting Physician Dorothea Ogle, MD  Outpatient Primary MD for the patient is No primary care provider on file.  LOS - 9  Outpatient Specialists: None  Chief Complaint  Patient presents with  . Loss of Consciousness       Brief Narrative   45 y.o.malewith known gastric bypass in 2012 and bleeding peptic ulcer approximately 2 years ago that required transfusion, now presented to the ED following a syncopal episode after his third bloody bowel movement of the day. Patient is from out of town, here for business and was in his usual state of health.  Patient developed abdominal pain on 8/4 and underwent urgent ex lap, was intubated post op and care transferred to [facility] assumed care from 08/18/2015.    Subjective:   Patient reports mild abdominal soreness   Assessment  & Plan :    Principal Problem:   Acute GI bleeding With symptomatic anemia EGD negative for active bleeding or ulcer but ?visible vessel noted at surgical anastomosis which was ablated with APC. Has received a total 7 unit PRBC. Negative bleeding scan. Appreciate GI recommendations.  Right-sided abdominal pain CT abdomen showed bowel obstruction with likely internal hernia and ossicle ischemia. Underwent exploratory laparotomy with reduction of internal hernia, ileocecectomy with anastomosis and placement of abdominal VAC. (Postop day 6) Patient then underwent a day laparotomy with wound excoriation and wound closure on 8/6. Stable postop. Advanced to regular diet. Completes 7 days of IV antibiotics  today.   Active Problems: History of gastric bypass  Acute kidney injury Secondary to dehydration and GI bleed Now resolved with IV fluids.  Hypokalemia/hypomagnesemia/ hypophosphatemia Replenish       Code Status : Full code  Family Communication  : None at bedside  Disposition Plan  : Home tomorrow  Barriers For Discharge : Improving symptoms  Consults  :   [facility] CM  Procedures  : EGD on 8/1 Exploratory laparotomy on 8/5 and  8/7  DVT Prophylaxis  :  Lovenox -   Lab Results  Component Value Date   PLT 411 (H) 08/20/2015    Antibiotics  :    Anti-infectives    Start     Dose/Rate Route Frequency Ordered Stop   08/16/15 1400  piperacillin-tazobactam (ZOSYN) IVPB 3.375 g     3.375 g 12.5 mL/hr over 240 Minutes Intravenous Every 8 hours 08/16/15 1242     08/15/15 0400  piperacillin-tazobactam (ZOSYN) IVPB 3.375 g  Status:  Discontinued     3.375 g 12.5 mL/hr over 240 Minutes Intravenous Every 8 hours 08/14/15 2247 08/16/15 1101   08/14/15 1900  piperacillin-tazobactam (ZOSYN) IVPB 3.375 g  Status:  Discontinued     3.375 g 12.5 mL/hr over 240 Minutes Intravenous NOW 08/14/15 1847 08/14/15 1857   08/14/15 1900  piperacillin-tazobactam (ZOSYN) IVPB 3.375 g     3.375 g 100 mL/hr over 30 Minutes Intravenous  Once 08/14/15 1857 08/14/15 1930        Objective:   Vitals:   08/19/15 1350 08/19/15 2102 08/20/15 0200 08/20/15 0552  BP: (!) 143/91 (!) 150/92 122/86 128/87  Pulse: 80 77 72 78  Resp: 16 16 16 16   Temp: 98.5 F (36.9 C) 99.3 F (37.4 C) 99.3 F (37.4 C) 99.4 F (37.4 C)  TempSrc: Oral Oral Oral Oral  SpO2: 100% 100% 100% 98%  Weight:      Height:        Wt Readings from Last 3 Encounters:  08/16/15 84 kg (185 lb 3 oz)     Intake/Output Summary (Last 24 hours) at 08/20/15 1335 Last data filed at 08/20/15 0544  Gross per 24 hour  Intake             1554 ml  Output                0 ml  Net             1554 ml      Physical Exam  Gen: not in distress HEENT: , moist mucosa, supple neck Chest: clear b/l, no added sounds CVS: N S1&S2, no murmurs, GI:  laparotomy scar, nondistended, minimal tenderness, bowel sounds present Musculoskeletal: warm, no edema     Data Review:    CBC  Recent Labs Lab 08/15/15 0555  08/16/15 0500 08/17/15 2334 08/18/15 0500 08/19/15 0315 08/20/15 0505  WBC 8.8  --   --  11.8* 11.0* 6.9 4.8  HGB 7.3*  < > 8.2* 8.2* 8.0* 8.2* 8.1*  HCT 22.0*  < > 24.7* 24.9* 24.2* 24.7* 24.8*  PLT 288  --   --  277 316 344 411*  MCV 83.7  --   --  81.6 80.7 81.0 79.5  MCH 27.8  --   --  26.9 26.7 26.9 26.0  MCHC 33.2  --   --  32.9 33.1 33.2 32.7  RDW 19.8*  --   --  18.6* 18.5* 18.5* 18.1*  LYMPHSABS  --   --   --  0.8 0.9  --   --   MONOABS  --   --   --  1.2* 1.4*  --   --   EOSABS  --   --   --  0.1 0.1  --   --   BASOSABS  --   --   --  0.0 0.0  --   --   < > = values in this interval not displayed.  Chemistries  Recent Labs Lab 08/16/15 0500 08/17/15 2334 08/18/15 0500 08/19/15 0315 08/20/15 0505  NA 135 139 140 134* 132*  K 4.1 4.1 4.1 2.7* 3.2*  CL 107 107 107 103 102  CO2 GLUCOSE 91 81 85 100* 87  BUN 26* CREATININE 1.04 0.85 0.93 0.67 0.81  CALCIUM 7.3* 6.3* 7.5* 7.1* 7.3*  MG 1.9  --  1.4* 1.9 1.6*  AST 24 19  --   --   --   ALT 11* 11*  --   --   --   ALKPHOS 39 76  --   --   --   BILITOT 1.4* 1.1  --   --   --    ------------------------------------------------------------------------------------------------------------------ No results for input(s): CHOL, HDL, LDLCALC, TRIG, CHOLHDL, LDLDIRECT in the last 72 hours.  Lab Results  Component Value Date   HGBA1C 5.2 08/15/2015   ------------------------------------------------------------------------------------------------------------------ No results for input(s): TSH, T4TOTAL, T3FREE, THYROIDAB in the last 72 hours.  Invalid input(s):  FREET3 ------------------------------------------------------------------------------------------------------------------ No results for input(s): VITAMINB12, FOLATE, FERRITIN, TIBC, IRON, RETICCTPCT in the last 72 hours.  Coagulation profile  Recent Labs Lab 08/15/15 1500 08/17/15 2334 08/18/15 0500  INR 1.64 1.18 1.21    No results for input(s): DDIMER in the last 72 hours.  Cardiac Enzymes  Recent Labs Lab 08/15/15 1500 08/15/15 2138 08/16/15 0300  TROPONINI <0.03 <0.03 <0.03   ------------------------------------------------------------------------------------------------------------------ No results found for: BNP  Inpatient Medications  Scheduled Meds: . ALPRAZolam  0.25 mg Oral Once  . antiseptic oral rinse  7 mL Mouth Rinse BID  . enoxaparin (LOVENOX) injection  40 mg Subcutaneous Q24H  . feeding supplement  1 Container Oral BID BM  . pantoprazole  40 mg Intravenous Q12H  . piperacillin-tazobactam  3.375 g Intravenous Q8H  . potassium & sodium phosphates  1 packet Oral TID WC & HS  . saccharomyces boulardii  250 mg Oral BID   Continuous Infusions: . sodium chloride 10 mL/hr at 08/16/15 1341  . lactated ringers 50 mL/hr at 08/20/15 0555   PRN Meds:.acetaminophen, fentaNYL (SUBLIMAZE) injection, sodium chloride flush  Micro Results Recent Results (from the past 240 hour(s))  MRSA PCR Screening     Status: None   Collection Time: 08/11/15  1:31 AM  Result Value Ref Range Status   MRSA by PCR NEGATIVE NEGATIVE Final    Comment:        The GeneXpert MRSA Assay (FDA approved for NASAL specimens only), is one component of a comprehensive MRSA colonization surveillance program. It is not intended to diagnose MRSA infection nor to guide or monitor treatment for MRSA infections.     Radiology Reports Nm Gi Blood Loss  Result Date: 08/12/2015 CLINICAL DATA:  Bloody stools since 08/10/2015, anemia, hemoglobin = 6.7 g/dL EXAM: NUCLEAR MEDICINE  GASTROINTESTINAL BLEEDING SCAN TECHNIQUE: Sequential abdominal images were obtained following intravenous administration of Tc-62m labeled red blood cells. RADIOPHARMACEUTICALS:  24.1 mCi Tc-20m in-vitro labeled autologous red cells IV. COMPARISON:  None FINDINGS: Imaging performed for 2 hours. Significant patient motion artifacts throughout exam. Normal blood pool distribution of labeled erythrocytes. Small amount of excreted de labeled technetium into urinary bladder. No abnormal gastrointestinal localization of labeled erythrocytes identified to suggest active GI bleeding. IMPRESSION: Negative GI bleeding study. Electronically Signed   By: Ulyses Southward M.D.   On: 08/12/2015 16:45   Dg Chest Port 1 View  Result Date: 08/14/2015 CLINICAL DATA:  45 year old male with endotracheal tube  placement. EXAM: PORTABLE CHEST 1 VIEW COMPARISON:  None. FINDINGS: Endotracheal tube the tip approximately 4 cm above the carina. An enteric tube courses into the epigastric area with tip likely in the proximal stomach and the side-port in the distal esophagus. Recommend advancement of the tube into the stomach. Patchy airspace opacity predominantly involving the left mid to lower lung field most compatible with pneumonia. Right mid lung field streaky and nodular densities also concerning for developing infiltrate. There is no pleural effusion or pneumothorax. The cardiac silhouette is within normal limits. No acute osseous pathology. IMPRESSION: Endotracheal tube above the carina. Bilateral pulmonary airspace opacities predominantly involving the left mid to lower lung field most compatible with pneumonia. Clinical correlation and follow-up resolution recommended. Electronically Signed   By: Elgie Collard M.D.   On: 08/14/2015 23:14   Ct Angio Abdomen Pelvis  W &/or Wo Contrast  Result Date: 08/14/2015 CLINICAL DATA:  Emesis.  GI bleeding. EXAM: CTA ABDOMEN AND PELVIS wITHOUT AND WITH CONTRAST TECHNIQUE: Multidetector CT  imaging of the abdomen and pelvis was performed using the standard protocol during bolus administration of intravenous contrast. Multiplanar reconstructed images and MIPs were obtained and reviewed to evaluate the vascular anatomy. CONTRAST:  100 mL of Isovue 370 COMPARISON:  None. FINDINGS: Infiltrate is seen in the lingula, left lower lobe, and medial right lower lobe. There is a small right-sided pleural effusion. The esophagus is dilated with fluid. No other acute abnormalities are identified in the lower chest. No free air. There is free fluid in the abdomen. The patient is status post gastric surgery. There is dilatation of the distal esophagus and gastric remnants. The efferent limb is dilated from the gastric remnant to the anastomosis with afferent limb. There is a twisting of vessels and bowel at the region of the transition point as seen on series 4, image 73. The small bowel is completely decompressed beyond this transition point. A second twisting of mesenteric vessels is seen more proximally as seen on coronal images 70 through 76. There is also a loop of small bowel which extends into the right side of the pelvis which is thick walled extending from axial image 71 into the pelvis such as on axial image 114. There is no pneumatosis in this location. The colon is relatively decompressed and contains fluid consistent with diarrhea. The appendix is not visualized but there is no secondary evidence of appendicitis. The gallbladder is distended. The liver, spleen, adrenal glands, and kidneys are normal. The pancreas is normal as well. The portal vein is normal. The abdominal aorta is non aneurysmal with no dissection. The proximal celiac, superior mesenteric, inferior mesenteric, and renal arteries are normal. There are 2 renal arteries on the right and 1 on the left. No adenopathy or mass in the pelvis. The bladder is normal. Prostate calcifications are noted. Degenerative changes are seen in the spine.  Review of the MIP images confirms the above findings. IMPRESSION: 1. Pneumonia in the lingula and lung bases. 2. There is a small bowel obstruction with associated swirling/twisting of the mesenteric vessels in two locations, dilated loops of bowel, and an abnormal thick walled loop of bowel extending into the pelvis. The findings are highly concerning for an internal hernia resulting in high-grade obstruction and the thickened loop of small bowel is very worrisome for an ischemic loop of small bowel. I discussed the findings with the patient's physician, Dr. Izola Price. Electronically Signed   By: Gerome Sam III M.D   On: 08/14/2015 17:46  Time Spent in minutes  25   Eddie North M.D on 08/20/2015 at 1:35 PM  Between 7am to 7pm - Pager - [phone]  After 7pm go to [email or web address] - password [facility]  [facility] -  Office  [phone]

## 2015-08-20 NOTE — Progress Notes (Signed)
[facility]:  [phone] General Surgery Progress Note   LOS: 9 days  POD -  4 Days Post-Op  Assessment/Plan: 1.  Laparoscopy, laparotomy with reduction of internal hernia, ileocecectomy with anastomosis and placement of abdominal  VAC dressing - 08/14/2015 - [facility] EXPLORATORY LAPAROTOMY WOUND EXPLORATION WOUND CLOSURE - 08/17/2015 - [facility]  For internal hernia with ischemic bowel  On Zosyn  Advancing diet to reg.  If tolerated, should be ready to go home tomorrow.  Will keep on Zosyn while in hospital, but I don't think that he needs to go home on anitibiotics.  Will follow up with Dr. Johna SheriffHoxworth in our office.  2.  History of RYGB - 2012  Successful weight loss of 300+ pounds 3.  Anemia  Hgb - 8.1 on 08/20/2015 4.  DVT prophylaxis - Lovenox   Principal Problem:   Bleeding gastrointestinal Active Problems:   Syncope   Symptomatic anemia   History of bleeding peptic ulcer   Lower GI bleeding   Hypokalemia   Subjective:  Doing well.  No complaint.  Has tolerated full liquids.  Objective:   Vitals:   08/20/15 0200 08/20/15 0552  BP: 122/86 128/87  Pulse: 72 78  Resp: 16 16  Temp: 99.3 F (37.4 C) 99.4 F (37.4 C)     Intake/Output from previous day:  08/09 0701 - 08/10 0700 In: 1554 [P.O.:240; I.V.:1264; IV Piggyback:50] Out: -   Intake/Output this shift:  No intake/output data recorded.   Physical Exam:   General: WN WM who is alert and oriented.    HEENT: Normal. Pupils equal. .   Lungs: Clear   Abdomen: Soft.  Has BS.   Wound: Clean.  Will shower today.   Lab Results:     Recent Labs  08/19/15 0315 08/20/15 0505  WBC 6.9 4.8  HGB 8.2* 8.1*  HCT 24.7* 24.8*  PLT 344 411*    BMET    Recent Labs  08/19/15 0315 08/20/15 0505  NA 134* 132*  K 2.7* 3.2*  CL 103 102  CO2 27 25  GLUCOSE 100* 87  BUN 11 7  CREATININE 0.67 0.81  CALCIUM 7.1* 7.3*    PT/INR    Recent Labs  08/17/15 2334 08/18/15 0500  LABPROT 15.1  15.4*  INR 1.18 1.21    ABG  No results for input(s): PHART, HCO3 in the last 72 hours.  Invalid input(s): PCO2, PO2   Studies/Results:  No results found.   Anti-infectives:   Anti-infectives    Start     Dose/Rate Route Frequency Ordered Stop   08/16/15 1400  piperacillin-tazobactam (ZOSYN) IVPB 3.375 g     3.375 g 12.5 mL/hr over 240 Minutes Intravenous Every 8 hours 08/16/15 1242     08/15/15 0400  piperacillin-tazobactam (ZOSYN) IVPB 3.375 g  Status:  Discontinued     3.375 g 12.5 mL/hr over 240 Minutes Intravenous Every 8 hours 08/14/15 2247 08/16/15 1101   08/14/15 1900  piperacillin-tazobactam (ZOSYN) IVPB 3.375 g  Status:  Discontinued     3.375 g 12.5 mL/hr over 240 Minutes Intravenous NOW 08/14/15 1847 08/14/15 1857   08/14/15 1900  piperacillin-tazobactam (ZOSYN) IVPB 3.375 g     3.375 g 100 mL/hr over 30 Minutes Intravenous  Once 08/14/15 1857 08/14/15 1930      Ovidio Kinavid Lillee Mooneyhan, MD, FACS Pager: [phone] [facility]: [phone] 08/20/2015

## 2015-08-20 NOTE — Discharge Instructions (Signed)
[facility]      [facility] [phone]  OPEN ABDOMINAL SURGERY: POST OP INSTRUCTIONS  Always review your discharge instruction sheet given to you by the facility where your surgery was performed.  IF YOU HAVE DISABILITY OR FAMILY LEAVE FORMS, YOU MUST BRING THEM TO THE OFFICE FOR PROCESSING.  PLEASE DO NOT GIVE THEM TO YOUR DOCTOR.  1. A prescription for pain medication may be given to you upon discharge.  Take your pain medication as prescribed, if needed.  If narcotic pain medicine is not needed, then you may take acetaminophen (Tylenol) or ibuprofen (Advil) as needed. 2. Take your usually prescribed medications unless otherwise directed. 3. If you need a refill on your pain medication, please contact your pharmacy. They will contact our office to request authorization.  Prescriptions will not be filled after 5pm or on week-ends. 4. You should follow a light diet the first few days after arrival home, such as soup and crackers, pudding, etc.unless your doctor has advised otherwise. A high-fiber, low fat diet can be resumed as tolerated.   Be sure to include lots of fluids daily. Most patients will experience some swelling and bruising on the chest and neck area.  Ice packs will help.  Swelling and bruising can take several days to resolve 5. Most patients will experience some swelling and bruising in the area of the incision. Ice pack will help. Swelling and bruising can take several days to resolve..  6. It is common to experience some constipation if taking pain medication after surgery.  Increasing fluid intake and taking a stool softener will usually help or prevent this problem from occurring.  A mild laxative (Milk of Magnesia or Miralax) should be taken according to package directions if there are no bowel movements after 48 hours. 7.  You may have steri-strips (small skin tapes) in place directly over the incision.  These strips should be left on the skin for 7-10 days.  If your  surgeon used skin glue on the incision, you may shower in 24 hours.  The glue will flake off over the next 2-3 weeks.  Any sutures or staples will be removed at the office during your follow-up visit. You may find that a light gauze bandage over your incision may keep your staples from being rubbed or pulled. You may shower and replace the bandage daily. 8. ACTIVITIES:  You may resume regular (light) daily activities beginning the next day--such as daily self-care, walking, climbing stairs--gradually increasing activities as tolerated.  You may have sexual intercourse when it is comfortable.  Refrain from any heavy lifting or straining until approved by your doctor. a. You may drive when you no longer are taking prescription pain medication, you can comfortably wear a seatbelt, and you can safely maneuver your car and apply brakes b. Return to Work: ___________________________________ 9. You should see your doctor in the office for a follow-up appointment approximately two weeks after your surgery.  Make sure that you call for this appointment within a day or two after you arrive home to insure a convenient appointment time. OTHER INSTRUCTIONS:  _____________________________________________________________ _____________________________________________________________  WHEN TO CALL YOUR DOCTOR: 1. Fever over 101.0 2. Inability to urinate 3. Nausea and/or vomiting 4. Extreme swelling or bruising 5. Continued bleeding from incision. 6. Increased pain, redness, or drainage from the incision. 7. Difficulty swallowing or breathing 8. Muscle cramping or spasms. 9. Numbness or tingling in hands or feet or around lips.  The clinic staff is available to   answer your questions during regular business hours.  Please don't hesitate to call and ask to speak to one of the nurses if you have concerns.  For further questions, please visit [email or web address]   

## 2015-08-21 DIAGNOSIS — K922 Gastrointestinal hemorrhage, unspecified: Secondary | ICD-10-CM

## 2015-08-21 DIAGNOSIS — K2971 Gastritis, unspecified, with bleeding: Secondary | ICD-10-CM

## 2015-08-21 DIAGNOSIS — K5669 Other intestinal obstruction: Secondary | ICD-10-CM

## 2015-08-21 DIAGNOSIS — K56609 Unspecified intestinal obstruction, unspecified as to partial versus complete obstruction: Secondary | ICD-10-CM | POA: Diagnosis not present

## 2015-08-21 DIAGNOSIS — K450 Other specified abdominal hernia with obstruction, without gangrene: Secondary | ICD-10-CM

## 2015-08-21 DIAGNOSIS — K559 Vascular disorder of intestine, unspecified: Secondary | ICD-10-CM

## 2015-08-21 LAB — MAGNESIUM: MAGNESIUM: 1.9 mg/dL (ref 1.7–2.4)

## 2015-08-21 LAB — PHOSPHORUS: Phosphorus: 3.4 mg/dL (ref 2.5–4.6)

## 2015-08-21 MED ORDER — PANTOPRAZOLE SODIUM 40 MG PO TBEC: 40.0000 mg | DELAYED_RELEASE_TABLET | Freq: Every day | ORAL | 0 refills | Status: AC

## 2015-08-21 MED ORDER — BOOST / RESOURCE BREEZE PO LIQD: 1.0000 | Freq: Two times a day (BID) | ORAL | 0 refills | Status: AC

## 2015-08-21 NOTE — Progress Notes (Signed)
Pt given discharge instructions and all questions answered. Waiting for family to arrive to discharge. Home.

## 2015-08-21 NOTE — Discharge Summary (Signed)
Physician Discharge Summary  Walter Shields ZOX:[id number] DOB: [birth year] DOA: 08/10/2015  PCP: No primary care provider on file.  Admit date: 08/10/2015 Discharge date: 08/21/2015  Admitted From: Home Disposition:  Home  Recommendations for Outpatient Follow-up:  Follow up with [facility] on 09/02/2015. (Will have abdominal staples removed on 08/26/2015)  Home Health: None Equipment/Devices: None  Discharge Condition: Stable CODE STATUS: Full code Diet recommendation: Regular    Discharge Diagnoses:  Principal Problem: Acute GI bleed  Active Problems:    Small bowel obstruction (HCC)   Ischemic bowel disease (HCC)   Obstructed internal hernia   Syncope   Symptomatic anemia   History of bleeding peptic ulcer   Lower GI bleeding   Hypokalemia   Hypomagnesemia   Hypophosphatemia    Brief narrative/history of present illness Please refer to admission H&P for details, in brief, 45 y.o.malewith known gastric bypass in 2012 and bleeding peptic ulcer approximately 2 years ago that required transfusion, now presented to the ED following a syncopal episode after his third bloody bowel movement of the day. Patient is from out of town, here for business and was in his usual state of health.  Patient developed abdominal pain on 8/4 and underwent urgent ex lap, was intubated post op and care transferred to [facility], [facility] assumed care from 08/18/2015.   Principal Problem:   Acute GI bleeding With symptomatic anemia EGD negative for active bleeding or ulcer but ?visible vessel noted at surgical anastomosis which was ablated with APC. Has received a total 17 unit PRBC. Negative bleeding scan. Appreciate GI recommendations. H&H now stable. Will discharge on oral daily Protonix.  Right-sided abdominal pain CT abdomen showed bowel obstruction with likely internal hernia and bowel ischemia. Underwent exploratory laparotomy with reduction of internal hernia, ileocecectomy with  anastomosis and placement of abdominal VAC. (Postop day 6).  Patient then underwent a day laparotomy with wound excoriation and wound closure on 8/6. Stable postop. Advanced to regular diet and tolerating well. Completed 7 days of antibiotics. Wound vac removed   Active Problems: History of gastric bypass  Acute kidney injury Secondary to dehydration and GI bleed  resolved with IV fluids.  Hypokalemia/hypomagnesemia/hypophosphatemia Likely GI loss postoperatively. Replenished  Protein calorie malnutrition Added supplement.        Family Communication  :  none at bedside  Disposition Plan  : Home   Consults  :   [facility]  Procedures  : EGD on 8/1 Expiratory laparotomy on 8/5 and 8/7   Discharge Instructions     Medication List    TAKE these medications   feeding supplement Liqd Take 1 Container by mouth 2 (two) times daily between meals.   ferrous sulfate 325 (65 FE) MG EC tablet Take 325 mg by mouth daily with breakfast.   Fish Oil 1000 MG Caps Take 1 capsule by mouth daily.   MULTIVITAMIN ADULT PO Take 1 tablet by mouth daily.   pantoprazole 40 MG tablet Commonly known as:  PROTONIX Take 1 tablet (40 mg total) by mouth daily. Switch for any other PPI at similar dose and frequency   vitamin B-12 500 MCG tablet Commonly known as:  CYANOCOBALAMIN Take 500 mcg by mouth daily.      Follow-up Information    Mariella Saa, MD Follow up on 09/02/2015.   Specialty:  General Surgery Why:  Your appointment is at 9 AM, be either 30 minutes early for check-in. Call sooner if there are any issues. Contact information:  [address] [place] [address] [phone]          No Known Allergies      Procedures/Studies: Nm Gi Blood Loss  Result Date: 08/12/2015 CLINICAL DATA:  Bloody stools since 08/10/2015, anemia, hemoglobin = 6.7 g/dL EXAM: NUCLEAR MEDICINE GASTROINTESTINAL BLEEDING  SCAN TECHNIQUE: Sequential abdominal images were obtained following intravenous administration of Tc-13m labeled red blood cells. RADIOPHARMACEUTICALS:  24.1 mCi Tc-53m in-vitro labeled autologous red cells IV. COMPARISON:  None FINDINGS: Imaging performed for 2 hours. Significant patient motion artifacts throughout exam. Normal blood pool distribution of labeled erythrocytes. Small amount of excreted de labeled technetium into urinary bladder. No abnormal gastrointestinal localization of labeled erythrocytes identified to suggest active GI bleeding. IMPRESSION: Negative GI bleeding study. Electronically Signed   By: Ulyses Southward M.D.   On: 08/12/2015 16:45   Dg Chest Port 1 View  Result Date: 08/14/2015 CLINICAL DATA:  45 year old male with endotracheal tube placement. EXAM: PORTABLE CHEST 1 VIEW COMPARISON:  None. FINDINGS: Endotracheal tube the tip approximately 4 cm above the carina. An enteric tube courses into the epigastric area with tip likely in the proximal stomach and the side-port in the distal esophagus. Recommend advancement of the tube into the stomach. Patchy airspace opacity predominantly involving the left mid to lower lung field most compatible with pneumonia. Right mid lung field streaky and nodular densities also concerning for developing infiltrate. There is no pleural effusion or pneumothorax. The cardiac silhouette is within normal limits. No acute osseous pathology. IMPRESSION: Endotracheal tube above the carina. Bilateral pulmonary airspace opacities predominantly involving the left mid to lower lung field most compatible with pneumonia. Clinical correlation and follow-up resolution recommended. Electronically Signed   By: Elgie Collard M.D.   On: 08/14/2015 23:14   Ct Angio Abdomen Pelvis  W &/or Wo Contrast  Result Date: 08/14/2015 CLINICAL DATA:  Emesis.  GI bleeding. EXAM: CTA ABDOMEN AND PELVIS wITHOUT AND WITH CONTRAST TECHNIQUE: Multidetector CT imaging of the abdomen and  pelvis was performed using the standard protocol during bolus administration of intravenous contrast. Multiplanar reconstructed images and MIPs were obtained and reviewed to evaluate the vascular anatomy. CONTRAST:  100 mL of Isovue 370 COMPARISON:  None. FINDINGS: Infiltrate is seen in the lingula, left lower lobe, and medial right lower lobe. There is a small right-sided pleural effusion. The esophagus is dilated with fluid. No other acute abnormalities are identified in the lower chest. No free air. There is free fluid in the abdomen. The patient is status post gastric surgery. There is dilatation of the distal esophagus and gastric remnants. The efferent limb is dilated from the gastric remnant to the anastomosis with afferent limb. There is a twisting of vessels and bowel at the region of the transition point as seen on series 4, image 73. The small bowel is completely decompressed beyond this transition point. A second twisting of mesenteric vessels is seen more proximally as seen on coronal images 70 through 76. There is also a loop of small bowel which extends into the right side of the pelvis which is thick walled extending from axial image 71 into the pelvis such as on axial image 114. There is no pneumatosis in this location. The colon is relatively decompressed and contains fluid consistent with diarrhea. The appendix is not visualized but there is no secondary evidence of appendicitis. The gallbladder is distended. The liver, spleen, adrenal glands, and kidneys are normal. The pancreas is normal as well. The portal vein is  normal. The abdominal aorta is non aneurysmal with no dissection. The proximal celiac, superior mesenteric, inferior mesenteric, and renal arteries are normal. There are 2 renal arteries on the right and 1 on the left. No adenopathy or mass in the pelvis. The bladder is normal. Prostate calcifications are noted. Degenerative changes are seen in the spine. Review of the MIP images  confirms the above findings. IMPRESSION: 1. Pneumonia in the lingula and lung bases. 2. There is a small bowel obstruction with associated swirling/twisting of the mesenteric vessels in two locations, dilated loops of bowel, and an abnormal thick walled loop of bowel extending into the pelvis. The findings are highly concerning for an internal hernia resulting in high-grade obstruction and the thickened loop of small bowel is very worrisome for an ischemic loop of small bowel. I discussed the findings with the patient's physician, Dr. Izola PriceMyers. Electronically Signed   By: Gerome Samavid  Williams III M.D   On: 08/14/2015 17:46       Subjective: No overnight issues. No abdominal pain. Feels better overall and tolerating regular diet.  Discharge Exam: Vitals:   08/20/15 2100 08/21/15 0544  BP: (!) 142/97 (!) 131/97  Pulse: 87 73  Resp: 16 16  Temp: 99.2 F (37.3 C) 98.3 F (36.8 C)   Vitals:   08/20/15 0552 08/20/15 1356 08/20/15 2100 08/21/15 0544  BP: 128/87 (!) 141/99 (!) 142/97 (!) 131/97  Pulse: 78 80 87 73  Resp: 16 16 16 16   Temp: 99.4 F (37.4 C) 99.3 F (37.4 C) 99.2 F (37.3 C) 98.3 F (36.8 C)  TempSrc: Oral Oral Oral Oral  SpO2: 98% 100% 100% 100%  Weight:      Height:        Gen: not in distress HEENT: Pallor present, moist mucosa, supple neck Chest: clear b/l, no added sounds CVS: N S1&S2, no murmurs, GI:   anterior abdominal wall staples, nondistended, nontender, bowel sounds present Musculoskeletal: warm, no edema      The results of significant diagnostics from this hospitalization (including imaging, microbiology, ancillary and laboratory) are listed below for reference.     Microbiology: No results found for this or any previous visit (from the past 240 hour(s)).   Labs: BNP (last 3 results) No results for input(s): BNP in the last 8760 hours. Basic Metabolic Panel:  Recent Labs Lab 08/16/15 0500 08/17/15 2334 08/18/15 0500 08/19/15 0315  08/20/15 0505 08/21/15 0500  NA 135 139 140 134* 132*  --   K 4.1 4.1 4.1 2.7* 3.2*  --   CL 107 107 107 103 102  --   CO2 24 23 25 27 25   --   GLUCOSE 91 81 85 100* 87  --   BUN 26* 17 17 11 7   --   CREATININE 1.04 0.85 0.93 0.67 0.81  --   CALCIUM 7.3* 6.3* 7.5* 7.1* 7.3*  --   MG 1.9  --  1.4* 1.9 1.6* 1.9  PHOS 3.7  --  2.7 2.0* 2.3* 3.4   Liver Function Tests:  Recent Labs Lab 08/16/15 0500 08/17/15 2334 08/18/15 0500  AST 24 19  --   ALT 11* 11*  --   ALKPHOS 39 76  --   BILITOT 1.4* 1.1  --   PROT 4.4* 4.5*  --   ALBUMIN 2.1*  2.1* 1.9* 1.8*    Recent Labs Lab 08/17/15 2334  LIPASE 61*   No results for input(s): AMMONIA in the last 168 hours. CBC:  Recent Labs Lab  08/15/15 0555  08/16/15 0500 08/17/15 2334 08/18/15 0500 08/19/15 0315 08/20/15 0505  WBC 8.8  --   --  11.8* 11.0* 6.9 4.8  NEUTROABS  --   --   --  9.6* 8.6*  --   --   HGB 7.3*  < > 8.2* 8.2* 8.0* 8.2* 8.1*  HCT 22.0*  < > 24.7* 24.9* 24.2* 24.7* 24.8*  MCV 83.7  --   --  81.6 80.7 81.0 79.5  PLT 288  --   --  277 316 344 411*  < > = values in this interval not displayed. Cardiac Enzymes:  Recent Labs Lab 08/15/15 1500 08/15/15 2138 08/16/15 0300 08/17/15 2334  CKTOTAL  --   --   --  59  TROPONINI <0.03 <0.03 <0.03  --    BNP: Invalid input(s): POCBNP CBG:  Recent Labs Lab 08/15/15 1555 08/15/15 2045 08/15/15 2355 08/16/15 0620 08/16/15 0807  GLUCAP 144* 121* 120* 94 100*   D-Dimer No results for input(s): DDIMER in the last 72 hours. Hgb A1c No results for input(s): HGBA1C in the last 72 hours. Lipid Profile No results for input(s): CHOL, HDL, LDLCALC, TRIG, CHOLHDL, LDLDIRECT in the last 72 hours. Thyroid function studies No results for input(s): TSH, T4TOTAL, T3FREE, THYROIDAB in the last 72 hours.  Invalid input(s): FREET3 Anemia work up No results for input(s): VITAMINB12, FOLATE, FERRITIN, TIBC, IRON, RETICCTPCT in the last 72 hours. Urinalysis     Component Value Date/Time   COLORURINE YELLOW 08/10/2015 2038   APPEARANCEUR CLEAR 08/10/2015 2038   LABSPEC 1.025 08/10/2015 2038   PHURINE 6.5 08/10/2015 2038   GLUCOSEU NEGATIVE 08/10/2015 2038   HGBUR NEGATIVE 08/10/2015 2038   BILIRUBINUR SMALL (A) 08/10/2015 2038   KETONESUR NEGATIVE 08/10/2015 2038   PROTEINUR NEGATIVE 08/10/2015 2038   NITRITE NEGATIVE 08/10/2015 2038   LEUKOCYTESUR TRACE (A) 08/10/2015 2038   Sepsis Labs Invalid input(s): PROCALCITONIN,  WBC,  LACTICIDVEN Microbiology No results found for this or any previous visit (from the past 240 hour(s)).   Time coordinating discharge: Over 30 minutes  SIGNED:   Eddie North, MD  [facility] 08/21/2015, 11:01 AM Pager   If 7PM-7AM, please contact night-coverage [email or web address] Password [email or web address]

## 2015-08-21 NOTE — Progress Notes (Addendum)
5 Days Post-Op  Subjective: He looks great, tolerating diet well. Suture line looks great, he's ready for discharge.  Objective: Vital signs in last 24 hours: Temp:  [98.3 F (36.8 C)-99.3 F (37.4 C)] 98.3 F (36.8 C) (08/11 0544) Pulse Rate:  [73-87] 73 (08/11 0544) Resp:  [16] 16 (08/11 0544) BP: (131-142)/(97-99) 131/97 (08/11 0544) SpO2:  [100 %] 100 % (08/11 0544) Last BM Date: 08/20/15 Afebrile vital signs are stable blood pressure still mildly elevated Lab stable Intake/Output from previous day: 08/10 0701 - 08/11 0700 In: 1246 [I.V.:1246] Out: -  Intake/Output this shift: Total I/O In: 240 [P.O.:240] Out: -   General appearance: alert, cooperative and no distress GI: Soft, not very sore. Site looks great staples are still in. We'll give him a follow-up appointment for staple removal in the office next week.  Lab Results:   Recent Labs  08/19/15 0315 08/20/15 0505  WBC 6.9 4.8  HGB 8.2* 8.1*  HCT 24.7* 24.8*  PLT 344 411*    BMET  Recent Labs  08/19/15 0315 08/20/15 0505  NA 134* 132*  K 2.7* 3.2*  CL 103 102  CO2 27 25  GLUCOSE 100* 87  BUN 11 7  CREATININE 0.67 0.81  CALCIUM 7.1* 7.3*   PT/INR No results for input(s): LABPROT, INR in the last 72 hours.   Recent Labs Lab 08/16/15 0500 08/17/15 2334 08/18/15 0500  AST 24 19  --   ALT 11* 11*  --   ALKPHOS 39 76  --   BILITOT 1.4* 1.1  --   PROT 4.4* 4.5*  --   ALBUMIN 2.1*  2.1* 1.9* 1.8*     Lipase     Component Value Date/Time   LIPASE 61 (H) 08/17/2015 2334     Studies/Results: No results found.  Medications: . ALPRAZolam  0.25 mg Oral Once  . antiseptic oral rinse  7 mL Mouth Rinse BID  . enoxaparin (LOVENOX) injection  40 mg Subcutaneous Q24H  . feeding supplement  1 Container Oral BID BM  . pantoprazole  40 mg Intravenous Q12H  . piperacillin-tazobactam  3.375 g Intravenous Q8H  . potassium & sodium phosphates  1 packet Oral TID WC & HS  . 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 Laparoscopy, laparotomy with reduction of internal hernia, ileocecectomy with anastomosis and placement of abdominal VAC dressing- 08/14/2015 - [facility] POD 7  2. EXPLORATORY LAPAROTOMY WOUND EXPLORATION WOUND CLOSURE - 08/16/2015 - [facility] POD 5  Anemia - stable FEN:   regular diet ID: 7 days of Zosyn completed on 08/20/15 DVT: Lovenox   Plan: Going home today off antibiotics. I will get him back to the office next week to remove staples condition on discharge: much improved. Staple removal 08/26/15 and follow-up with Dr. Johna SheriffHoxworth on 09/02/15.  LOS: 10 days    Walter Shields,Walter Shields 08/21/2015 [phone]  Agree with above. He has done very well.  Ovidio Kinavid Lashondra Vaquerano, MD, [facility] Pager: [phone] Office phone:  [phone]

## 2018-02-13 IMAGING — CT CT CTA ABD/PEL W/CM AND/OR W/O CM
2 of 10 series · 11 of 46 positions shown, 18 images · IV contrast (ISOVUE)
Comparison: None.

CLINICAL DATA: Emesis.  GI bleeding.

EXAM:
CTA ABDOMEN AND PELVIS wITHOUT AND WITH CONTRAST
TECHNIQUE: Multidetector CT imaging of the abdomen and pelvis was performed
using the standard protocol during bolus administration of
intravenous contrast. Multiplanar reconstructed images and MIPs were
obtained and reviewed to evaluate the vascular anatomy.
CONTRAST:  100 mL of Isovue 370

[Series 6: venous · axial · portal-venous · 0.83mm/px · z∈[-480,-70]mm · 10 of 165 slices shown, 16 images]
[im 14/165  soft-tissue]
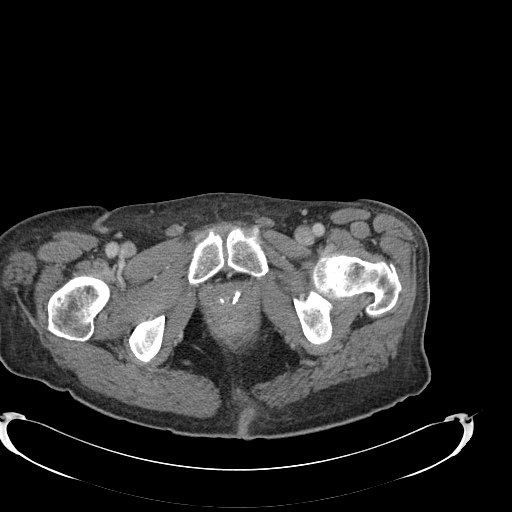
[im 14/165  bone]
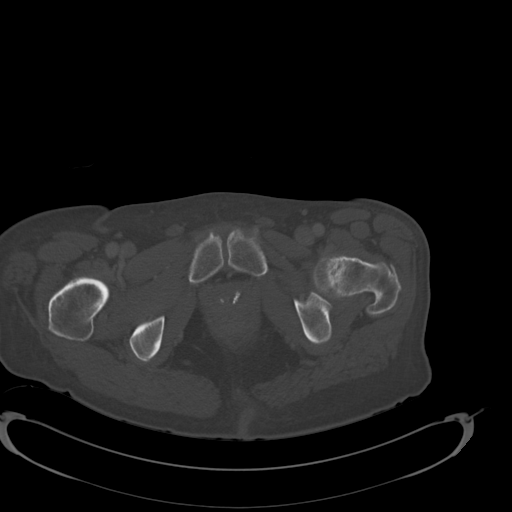
[im 28/165  soft-tissue]
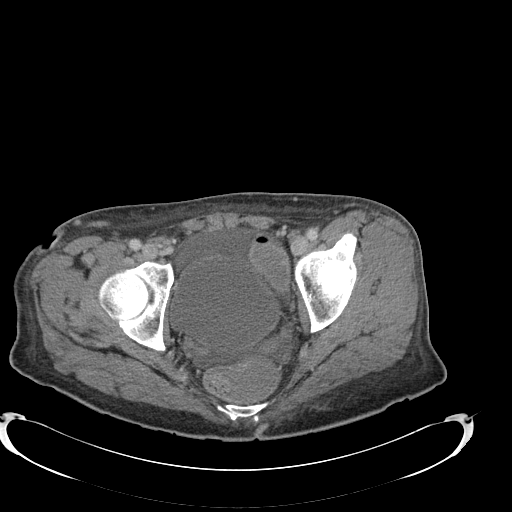
[im 42/165  soft-tissue]
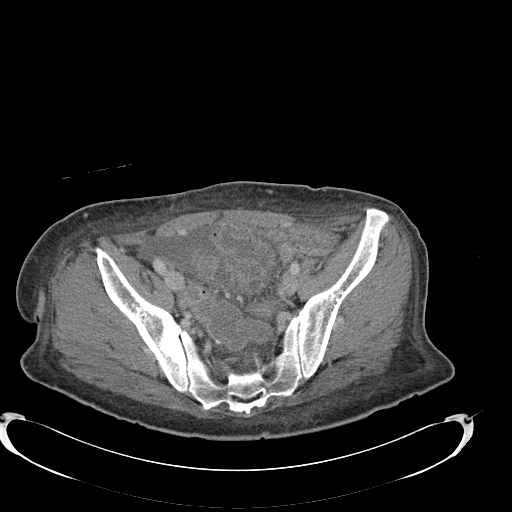
[im 55/165  soft-tissue]
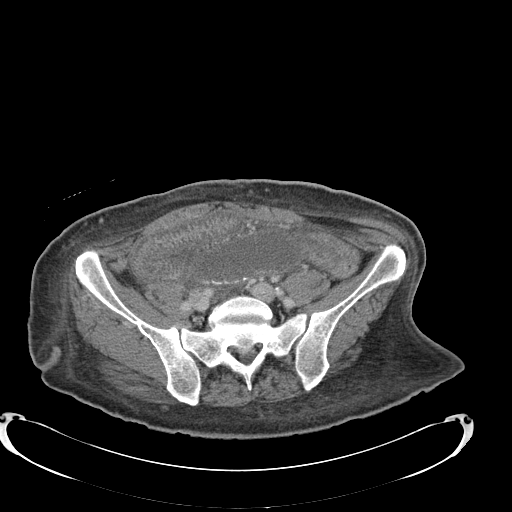
[im 69/165  soft-tissue]
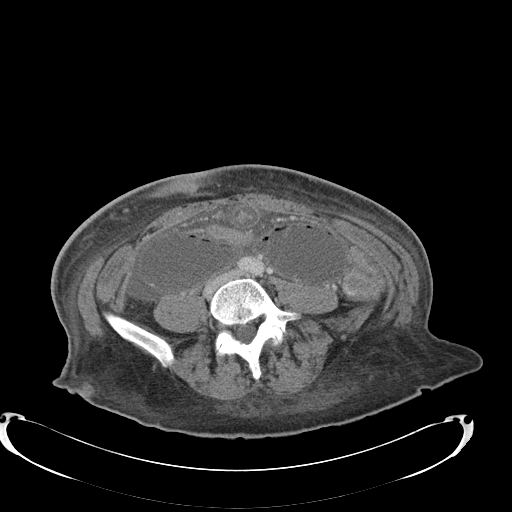
[im 96/165  soft-tissue]
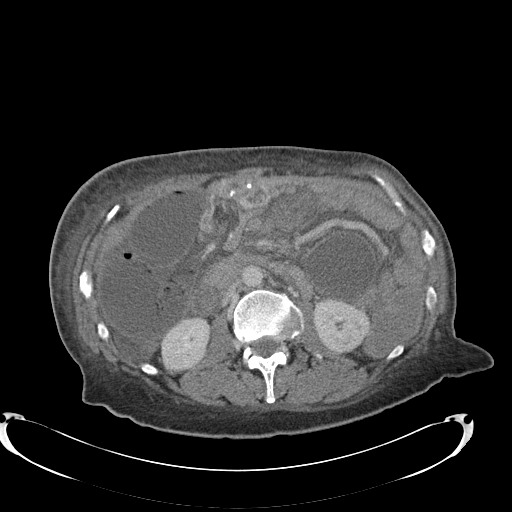
[im 110/165  soft-tissue]
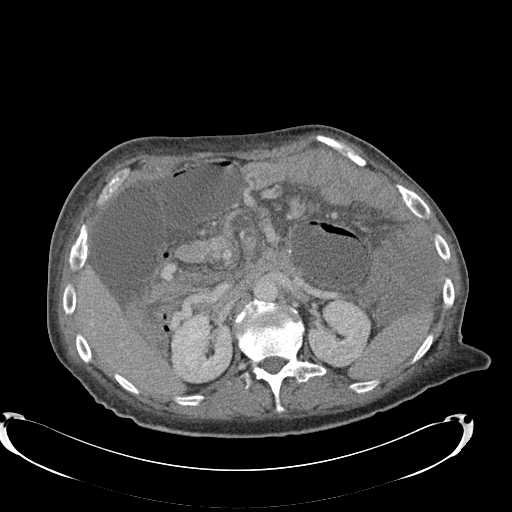
[im 110/165  lung]
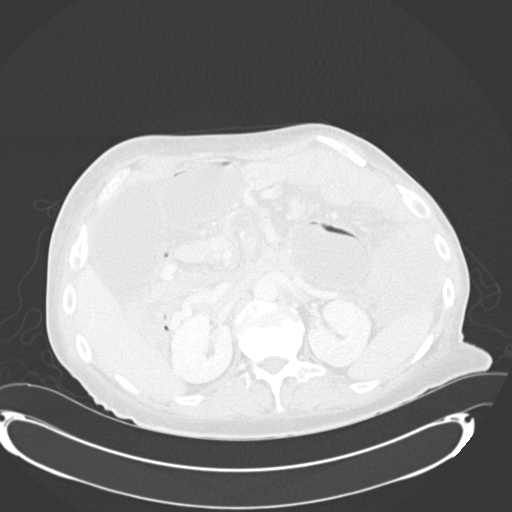
[im 124/165  soft-tissue]
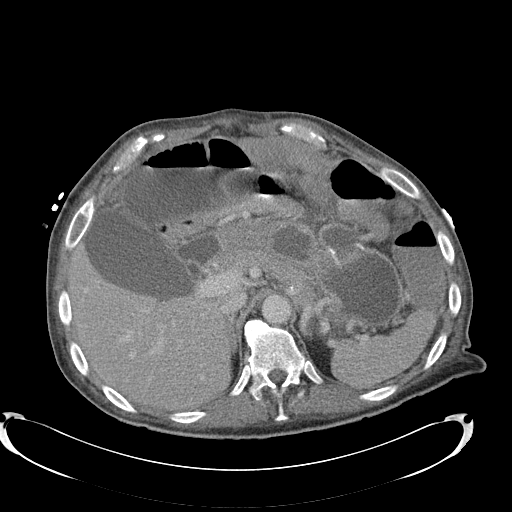
[im 124/165  lung]
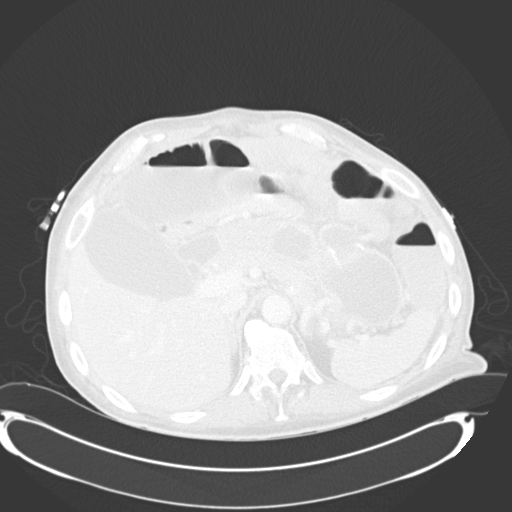
[im 137/165  soft-tissue]
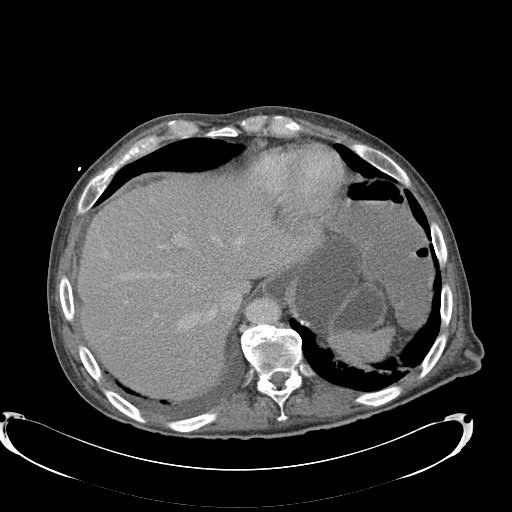
[im 137/165  lung]
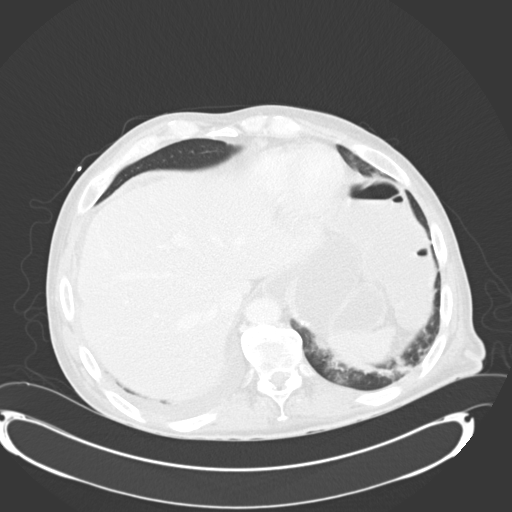
[im 137/165  bone]
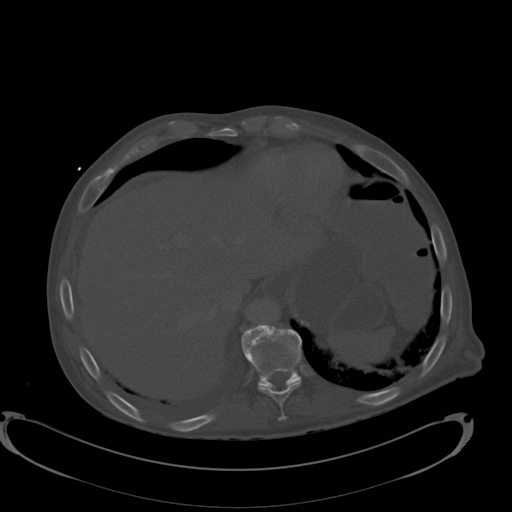
[im 151/165  soft-tissue]
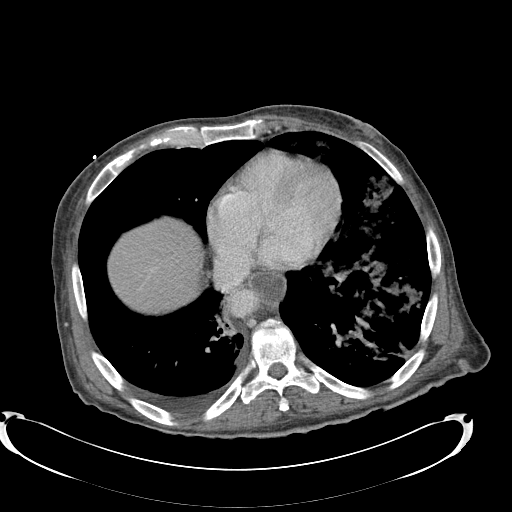
[im 151/165  lung]
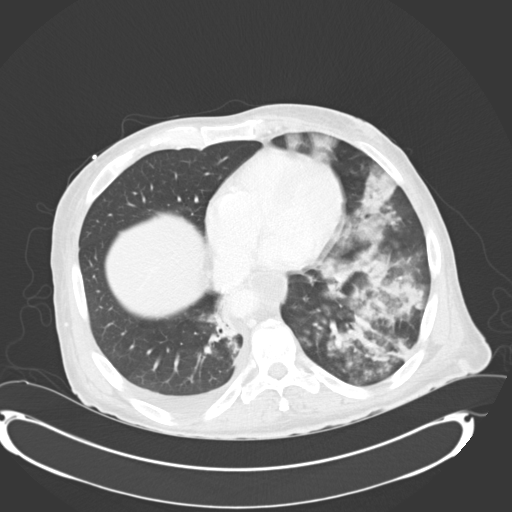

[Series 602: <mpr thick range> · coronal · 0.96mm/px · 1 of 146 slices shown, 2 images]
[im 73/146  soft-tissue]
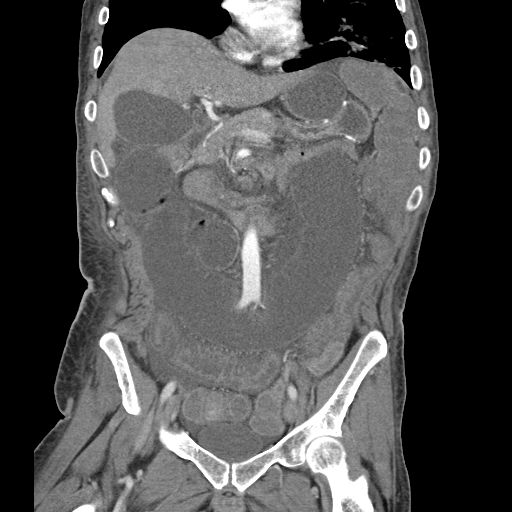
[im 73/146  bone]
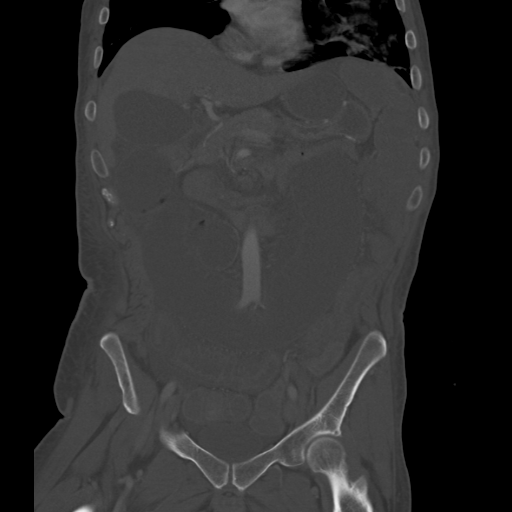

[11 of 46 positions shown; findings below may reference images not displayed]

FINDINGS: Infiltrate is seen in the lingula, left lower lobe, and medial right
lower lobe. There is a small right-sided pleural effusion. The
esophagus is dilated with fluid. No other acute abnormalities are
identified in the lower chest.

No free air. There is free fluid in the abdomen. The patient is
status post gastric surgery. There is dilatation of the distal
esophagus and gastric remnants. The efferent limb is dilated from
the gastric remnant to the anastomosis with afferent limb. There is
a twisting of vessels and bowel at the region of the transition
point as seen on series 4, image 73. The small bowel is completely
decompressed beyond this transition point. A second twisting of
mesenteric vessels is seen more proximally as seen on coronal images
70 through 76. There is also a loop of small bowel which extends
into the right side of the pelvis which is thick walled extending
from axial image 71 into the pelvis such as on axial image 114.
There is no pneumatosis in this location. The colon is relatively
decompressed and contains fluid consistent with diarrhea. The
appendix is not visualized but there is no secondary evidence of
appendicitis. The gallbladder is distended. The liver, spleen,
adrenal glands, and kidneys are normal. The pancreas is normal as
well. The portal vein is normal. The abdominal aorta is non
aneurysmal with no dissection. The proximal celiac, superior
mesenteric, inferior mesenteric, and renal arteries are normal.
There are 2 renal arteries on the right and 1 on the left.

No adenopathy or mass in the pelvis. The bladder is normal. Prostate
calcifications are noted.

Degenerative changes are seen in the spine.

Review of the MIP images confirms the above findings.
IMPRESSION: 1. Pneumonia in the lingula and lung bases.
2. There is a small bowel obstruction with associated
swirling/twisting of the mesenteric vessels in two locations,
dilated loops of bowel, and an abnormal thick walled loop of bowel
extending into the pelvis. The findings are highly concerning for an
internal hernia resulting in high-grade obstruction and the
thickened loop of small bowel is very worrisome for an ischemic loop
of small bowel.
I discussed the findings with the patient's physician, Dr. Ohalloran.

## 2018-02-13 IMAGING — DX DG CHEST 1V PORT
1 series · 1 of 1 positions shown · non-contrast
Comparison: None.

CLINICAL DATA: 44-year-old male with endotracheal tube placement.

EXAM:
PORTABLE CHEST 1 VIEW

[chest ap]
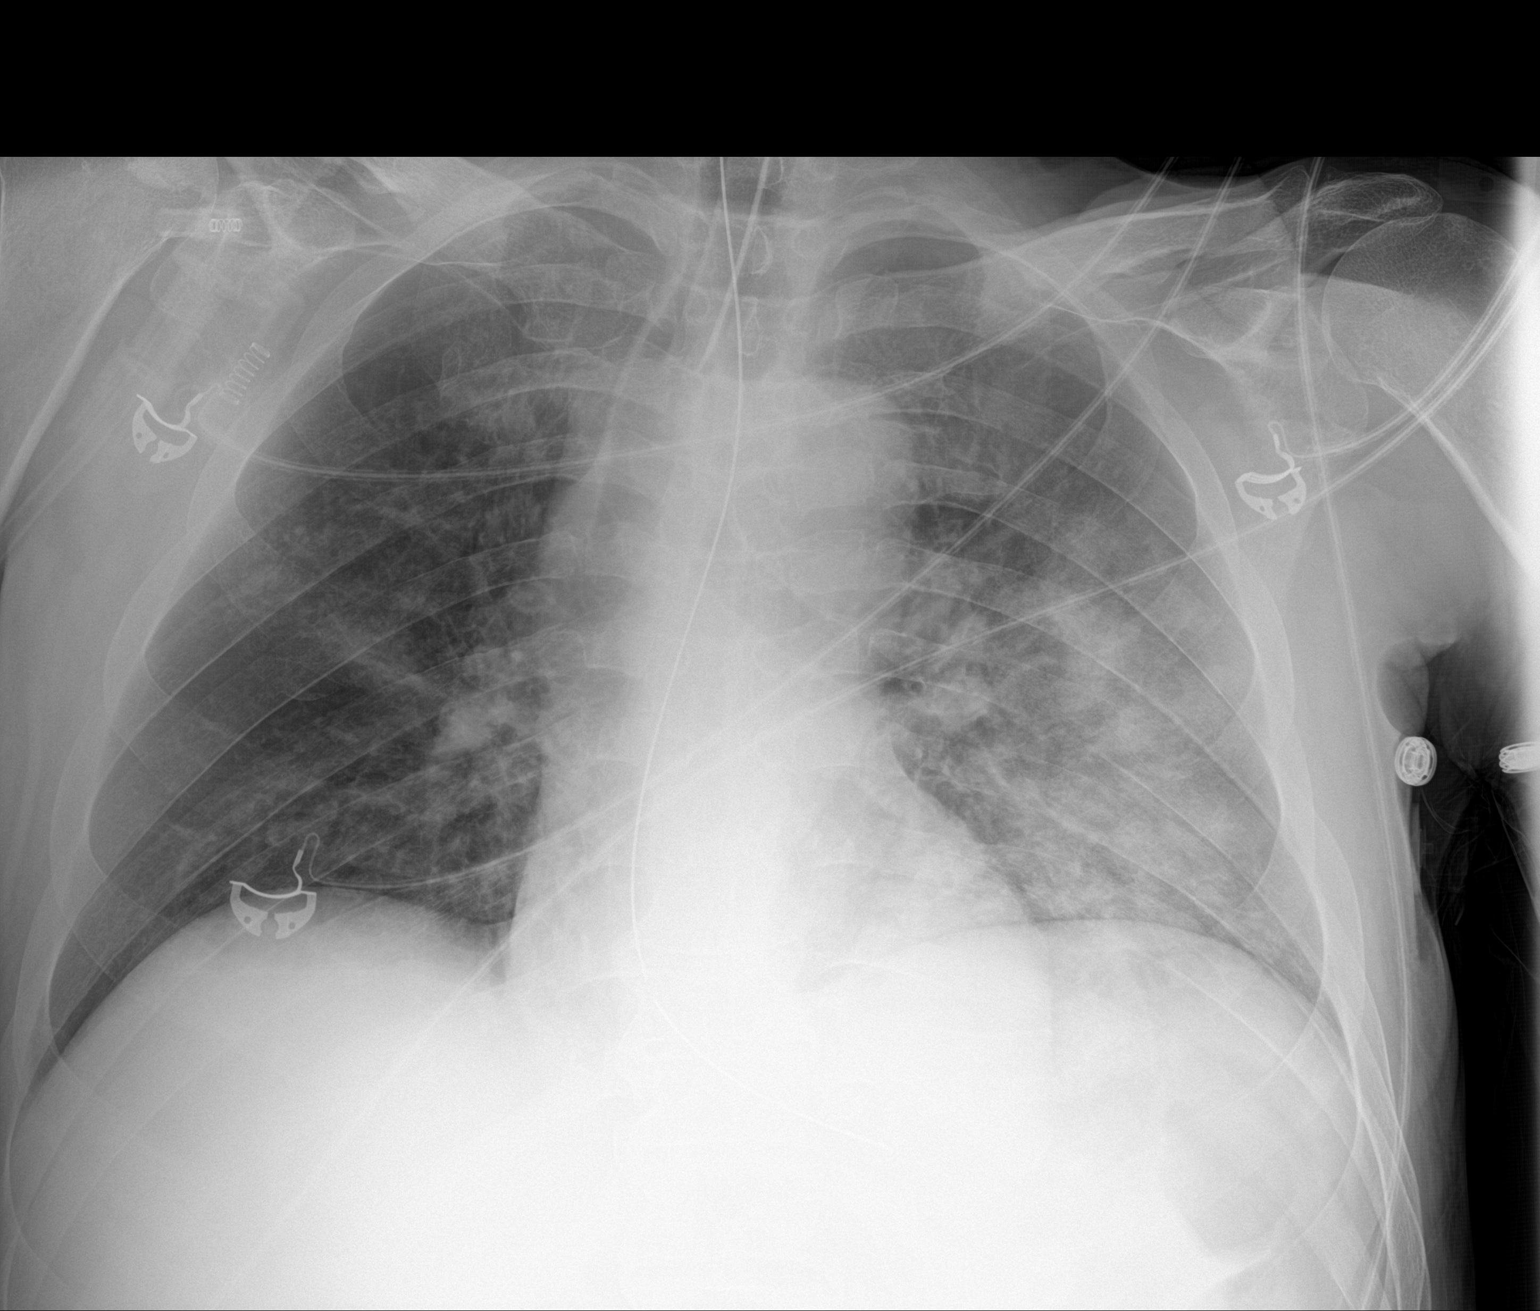

[1 of 1 positions shown; findings below may reference images not displayed]

FINDINGS: Endotracheal tube the tip approximately 4 cm above the carina. An
enteric tube courses into the epigastric area with tip likely in the
proximal stomach and the side-port in the distal esophagus.
Recommend advancement of the tube into the stomach.

Patchy airspace opacity predominantly involving the left mid to
lower lung field most compatible with pneumonia. Right mid lung
field streaky and nodular densities also concerning for developing
infiltrate. There is no pleural effusion or pneumothorax. The
cardiac silhouette is within normal limits. No acute osseous
pathology.
IMPRESSION: Endotracheal tube above the carina.

Bilateral pulmonary airspace opacities predominantly involving the
left mid to lower lung field most compatible with pneumonia.
Clinical correlation and follow-up resolution recommended.
